# Patient Record
Sex: Female | Born: 1945 | Race: White | Hispanic: No | Marital: Married | Smoking: Never smoker
Health system: Southern US, Community
[De-identification: ages and names within clinical notes are randomized; demographics above are authoritative.]

## PROBLEM LIST (undated history)

## (undated) DIAGNOSIS — Z803 Family history of malignant neoplasm of breast: Secondary | ICD-10-CM

## (undated) DIAGNOSIS — M1990 Unspecified osteoarthritis, unspecified site: Secondary | ICD-10-CM

## (undated) DIAGNOSIS — I10 Essential (primary) hypertension: Secondary | ICD-10-CM

## (undated) DIAGNOSIS — K219 Gastro-esophageal reflux disease without esophagitis: Secondary | ICD-10-CM

## (undated) DIAGNOSIS — Z806 Family history of leukemia: Secondary | ICD-10-CM

## (undated) DIAGNOSIS — R7303 Prediabetes: Secondary | ICD-10-CM

## (undated) DIAGNOSIS — Z8051 Family history of malignant neoplasm of kidney: Secondary | ICD-10-CM

## (undated) DIAGNOSIS — E7800 Pure hypercholesterolemia, unspecified: Secondary | ICD-10-CM

## (undated) HISTORY — DX: Family history of malignant neoplasm of kidney: Z80.51

## (undated) HISTORY — DX: Family history of malignant neoplasm of breast: Z80.3

## (undated) HISTORY — PX: TUBAL LIGATION: SHX77

## (undated) HISTORY — DX: Family history of leukemia: Z80.6

---

## 1998-06-21 ENCOUNTER — Other Ambulatory Visit: Admission: RE | Admit: 1998-06-21 | Discharge: 1998-06-21 | Payer: Self-pay | Admitting: Obstetrics and Gynecology

## 1999-07-12 ENCOUNTER — Encounter: Payer: Self-pay | Admitting: Obstetrics and Gynecology

## 1999-07-12 ENCOUNTER — Encounter: Admission: RE | Admit: 1999-07-12 | Discharge: 1999-07-12 | Payer: Self-pay | Admitting: Obstetrics and Gynecology

## 1999-08-02 ENCOUNTER — Other Ambulatory Visit: Admission: RE | Admit: 1999-08-02 | Discharge: 1999-08-02 | Payer: Self-pay | Admitting: Obstetrics and Gynecology

## 2000-08-04 ENCOUNTER — Encounter: Payer: Self-pay | Admitting: Obstetrics and Gynecology

## 2000-08-04 ENCOUNTER — Encounter: Admission: RE | Admit: 2000-08-04 | Discharge: 2000-08-04 | Payer: Self-pay | Admitting: Obstetrics and Gynecology

## 2000-09-21 ENCOUNTER — Other Ambulatory Visit: Admission: RE | Admit: 2000-09-21 | Discharge: 2000-09-21 | Payer: Self-pay | Admitting: Obstetrics and Gynecology

## 2001-09-22 ENCOUNTER — Encounter: Payer: Self-pay | Admitting: Obstetrics and Gynecology

## 2001-09-22 ENCOUNTER — Encounter: Admission: RE | Admit: 2001-09-22 | Discharge: 2001-09-22 | Payer: Self-pay | Admitting: Internal Medicine

## 2002-06-28 ENCOUNTER — Ambulatory Visit (HOSPITAL_COMMUNITY): Admission: RE | Admit: 2002-06-28 | Discharge: 2002-06-28 | Payer: Self-pay | Admitting: Gastroenterology

## 2002-06-28 ENCOUNTER — Encounter (INDEPENDENT_AMBULATORY_CARE_PROVIDER_SITE_OTHER): Payer: Self-pay | Admitting: Specialist

## 2002-10-05 ENCOUNTER — Other Ambulatory Visit: Admission: RE | Admit: 2002-10-05 | Discharge: 2002-10-05 | Payer: Self-pay | Admitting: Family Medicine

## 2002-10-07 ENCOUNTER — Encounter: Admission: RE | Admit: 2002-10-07 | Discharge: 2002-10-07 | Payer: Self-pay | Admitting: Family Medicine

## 2002-10-07 ENCOUNTER — Encounter: Payer: Self-pay | Admitting: Family Medicine

## 2002-11-14 ENCOUNTER — Encounter: Admission: RE | Admit: 2002-11-14 | Discharge: 2002-12-20 | Payer: Self-pay | Admitting: Neurosurgery

## 2003-10-19 ENCOUNTER — Ambulatory Visit (HOSPITAL_COMMUNITY): Admission: RE | Admit: 2003-10-19 | Discharge: 2003-10-19 | Payer: Self-pay | Admitting: Family Medicine

## 2004-02-15 ENCOUNTER — Ambulatory Visit: Payer: Self-pay | Admitting: Cardiology

## 2004-02-23 ENCOUNTER — Ambulatory Visit: Payer: Self-pay

## 2004-11-05 ENCOUNTER — Ambulatory Visit (HOSPITAL_COMMUNITY): Admission: RE | Admit: 2004-11-05 | Discharge: 2004-11-05 | Payer: Self-pay | Admitting: Family Medicine

## 2005-11-13 ENCOUNTER — Ambulatory Visit (HOSPITAL_COMMUNITY): Admission: RE | Admit: 2005-11-13 | Discharge: 2005-11-13 | Payer: Self-pay | Admitting: Family Medicine

## 2005-11-13 ENCOUNTER — Other Ambulatory Visit: Admission: RE | Admit: 2005-11-13 | Discharge: 2005-11-13 | Payer: Self-pay | Admitting: Family Medicine

## 2006-12-04 ENCOUNTER — Ambulatory Visit (HOSPITAL_COMMUNITY): Admission: RE | Admit: 2006-12-04 | Discharge: 2006-12-04 | Payer: Self-pay | Admitting: Family Medicine

## 2007-12-08 ENCOUNTER — Ambulatory Visit: Payer: Self-pay | Admitting: Cardiology

## 2007-12-17 ENCOUNTER — Ambulatory Visit (HOSPITAL_COMMUNITY): Admission: RE | Admit: 2007-12-17 | Discharge: 2007-12-17 | Payer: Self-pay | Admitting: Family Medicine

## 2008-12-20 ENCOUNTER — Ambulatory Visit (HOSPITAL_COMMUNITY): Admission: RE | Admit: 2008-12-20 | Discharge: 2008-12-20 | Payer: Self-pay | Admitting: Family Medicine

## 2010-01-27 ENCOUNTER — Encounter: Payer: Self-pay | Admitting: Family Medicine

## 2010-01-31 ENCOUNTER — Ambulatory Visit (HOSPITAL_COMMUNITY)
Admission: RE | Admit: 2010-01-31 | Discharge: 2010-01-31 | Payer: Self-pay | Source: Home / Self Care | Attending: Family Medicine | Admitting: Family Medicine

## 2010-05-24 NOTE — Op Note (Signed)
NAMECHASTIN, Angel Barry NO.:  [id number]   MEDICAL RECORD NO.:  [id number]                   PATIENT TYPE:   LOCATION:                                       FACILITY:   PHYSICIAN:  Petra Kuba, M.D.                 DATE OF BIRTH:   DATE OF PROCEDURE:  06/28/2002  DATE OF DISCHARGE:                                 OPERATIVE REPORT   PROCEDURE:  Colonoscopy.   INDICATIONS:  Bright red blood per rectum, constipation, family history of  colon polyps, due for colonic screening.  Consent was signed after risks,  benefits, methods, and options thoroughly discussed in the office.   MEDICINES USED:  Demerol 60 mg, Versed 6 mg.   PROCEDURE:  Rectal inspection was pertinent for external hemorrhoids, small.  Digital exam was negative.  The video pediatric adjustable colonoscope was  inserted and with abdominal pressure advanced around the colon to the cecum.  No abnormalities were seen on insertion, and no position change was needed.  The cecum was identified by the appendiceal orifice and the ileocecal valve.  The scope was slowly withdrawn.  On slow withdrawal through the colon, the  prep was adequate.  There was some liquid stool that required washing and  suctioning.  In the more proximal transverse, a tiny to small possible polyp  was seen and was hot biopsied x1.  On slow withdrawal through the remaining  part of the colon, no abnormalities were seen.  Specifically, no other  polyps, masses, diverticula, or signs of bleeding.  Anorectal pull-through  and retroflexion confirmed some hemorrhoids in the rectum.  The scope was  reinserted a short way up the left side of the colon, air was suctioned,  scope removed.  The patient tolerated the procedure well.  There was no  obvious immediate complication.   ENDOSCOPIC DIAGNOSES:  1. Internal-external hemorrhoids.  2. One tiny to small proximal transverse polyp, hot biopsied.  3. Otherwise within normal  limits to the cecum.   PLAN:  Await pathology to determine future colonic screening.  Happy to see  back p.r.n., otherwise return care to Dr. Celene Skeen for the customary health  care maintenance to include yearly rectals and guaiacs, and would  follow up  to her for constipation and hemorrhoidal treatment and re-referral if  question or problem.  Might consider Crystallose, Miralax, or even Zelnorm  at some point in the future and various hemorrhoidal creams or suppositories  as needed.                                               Petra Kuba, M.D.    MEM/MEDQ  D:  06/28/2002  T:  06/28/2002  Job:  [id number]   cc:   Byrd Hesselbach  [place]  [address]  [place] [address]  Fax: [phone]

## 2011-02-03 ENCOUNTER — Other Ambulatory Visit (HOSPITAL_COMMUNITY): Payer: Self-pay | Admitting: Family Medicine

## 2011-02-03 DIAGNOSIS — Z139 Encounter for screening, unspecified: Secondary | ICD-10-CM

## 2011-02-06 ENCOUNTER — Ambulatory Visit (HOSPITAL_COMMUNITY)
Admission: RE | Admit: 2011-02-06 | Discharge: 2011-02-06 | Disposition: A | Discharge status: Home / Self Care | Payer: Medicare Other | Source: Ambulatory Visit | Attending: Family Medicine | Admitting: Family Medicine

## 2011-02-06 DIAGNOSIS — Z139 Encounter for screening, unspecified: Secondary | ICD-10-CM

## 2011-02-06 DIAGNOSIS — Z1231 Encounter for screening mammogram for malignant neoplasm of breast: Secondary | ICD-10-CM | POA: Insufficient documentation

## 2012-02-04 ENCOUNTER — Other Ambulatory Visit (HOSPITAL_COMMUNITY): Payer: Self-pay | Admitting: Family Medicine

## 2012-02-04 DIAGNOSIS — Z139 Encounter for screening, unspecified: Secondary | ICD-10-CM

## 2012-02-09 ENCOUNTER — Ambulatory Visit (HOSPITAL_COMMUNITY)
Admission: RE | Admit: 2012-02-09 | Discharge: 2012-02-09 | Disposition: A | Discharge status: Home / Self Care | Payer: PRIVATE HEALTH INSURANCE | Source: Ambulatory Visit | Attending: Family Medicine | Admitting: Family Medicine

## 2012-02-09 DIAGNOSIS — Z1231 Encounter for screening mammogram for malignant neoplasm of breast: Secondary | ICD-10-CM | POA: Insufficient documentation

## 2012-02-09 DIAGNOSIS — Z139 Encounter for screening, unspecified: Secondary | ICD-10-CM

## 2013-02-11 ENCOUNTER — Other Ambulatory Visit (HOSPITAL_COMMUNITY): Payer: Self-pay | Admitting: Family Medicine

## 2013-02-11 DIAGNOSIS — Z139 Encounter for screening, unspecified: Secondary | ICD-10-CM

## 2013-02-17 ENCOUNTER — Ambulatory Visit (HOSPITAL_COMMUNITY)
Admission: RE | Admit: 2013-02-17 | Discharge: 2013-02-17 | Disposition: A | Discharge status: Home / Self Care | Payer: Medicare Other | Source: Ambulatory Visit | Attending: Family Medicine | Admitting: Family Medicine

## 2013-02-17 ENCOUNTER — Other Ambulatory Visit (HOSPITAL_COMMUNITY): Payer: Self-pay | Admitting: Family Medicine

## 2013-02-17 DIAGNOSIS — Z1231 Encounter for screening mammogram for malignant neoplasm of breast: Secondary | ICD-10-CM | POA: Insufficient documentation

## 2013-02-21 ENCOUNTER — Other Ambulatory Visit: Payer: Self-pay | Admitting: Family Medicine

## 2013-02-21 DIAGNOSIS — R928 Other abnormal and inconclusive findings on diagnostic imaging of breast: Secondary | ICD-10-CM

## 2013-03-09 ENCOUNTER — Ambulatory Visit (HOSPITAL_COMMUNITY)
Admission: RE | Admit: 2013-03-09 | Discharge: 2013-03-09 | Disposition: A | Discharge status: Home / Self Care | Payer: Medicare Other | Source: Ambulatory Visit | Attending: Family Medicine | Admitting: Family Medicine

## 2013-03-09 DIAGNOSIS — R928 Other abnormal and inconclusive findings on diagnostic imaging of breast: Secondary | ICD-10-CM | POA: Insufficient documentation

## 2013-03-17 ENCOUNTER — Other Ambulatory Visit: Payer: Self-pay | Admitting: Gastroenterology

## 2013-05-18 ENCOUNTER — Other Ambulatory Visit (HOSPITAL_COMMUNITY): Payer: Self-pay | Admitting: Adult Health Nurse Practitioner

## 2013-05-18 DIAGNOSIS — M810 Age-related osteoporosis without current pathological fracture: Secondary | ICD-10-CM

## 2013-05-23 ENCOUNTER — Other Ambulatory Visit (HOSPITAL_COMMUNITY): Payer: Medicare Other

## 2013-06-13 ENCOUNTER — Ambulatory Visit (HOSPITAL_COMMUNITY)
Admission: RE | Admit: 2013-06-13 | Discharge: 2013-06-13 | Disposition: A | Discharge status: Home / Self Care | Payer: Medicare Other | Source: Ambulatory Visit | Attending: Adult Health Nurse Practitioner | Admitting: Adult Health Nurse Practitioner

## 2013-06-13 DIAGNOSIS — M810 Age-related osteoporosis without current pathological fracture: Secondary | ICD-10-CM | POA: Insufficient documentation

## 2014-01-03 ENCOUNTER — Ambulatory Visit: Payer: Medicare Other | Admitting: Neurology

## 2014-04-04 ENCOUNTER — Other Ambulatory Visit (HOSPITAL_COMMUNITY): Payer: Self-pay | Admitting: Family Medicine

## 2014-04-04 DIAGNOSIS — Z1231 Encounter for screening mammogram for malignant neoplasm of breast: Secondary | ICD-10-CM

## 2014-04-17 ENCOUNTER — Ambulatory Visit (HOSPITAL_COMMUNITY)
Admission: RE | Admit: 2014-04-17 | Discharge: 2014-04-17 | Disposition: A | Discharge status: Home / Self Care | Payer: Medicare Other | Source: Ambulatory Visit | Attending: Family Medicine | Admitting: Family Medicine

## 2014-04-17 DIAGNOSIS — Z1231 Encounter for screening mammogram for malignant neoplasm of breast: Secondary | ICD-10-CM | POA: Insufficient documentation

## 2015-05-02 ENCOUNTER — Other Ambulatory Visit (HOSPITAL_COMMUNITY): Payer: Self-pay | Admitting: Family Medicine

## 2015-05-02 DIAGNOSIS — Z1231 Encounter for screening mammogram for malignant neoplasm of breast: Secondary | ICD-10-CM

## 2015-05-04 ENCOUNTER — Ambulatory Visit (HOSPITAL_COMMUNITY)
Admission: RE | Admit: 2015-05-04 | Discharge: 2015-05-04 | Disposition: A | Discharge status: Home / Self Care | Payer: Medicare Other | Source: Ambulatory Visit | Attending: Family Medicine | Admitting: Family Medicine

## 2015-05-04 DIAGNOSIS — Z1231 Encounter for screening mammogram for malignant neoplasm of breast: Secondary | ICD-10-CM

## 2016-04-23 ENCOUNTER — Other Ambulatory Visit (HOSPITAL_COMMUNITY): Payer: Self-pay | Admitting: Family Medicine

## 2016-04-23 DIAGNOSIS — Z1231 Encounter for screening mammogram for malignant neoplasm of breast: Secondary | ICD-10-CM

## 2016-05-05 ENCOUNTER — Ambulatory Visit (HOSPITAL_COMMUNITY)
Admission: RE | Admit: 2016-05-05 | Discharge: 2016-05-05 | Disposition: A | Discharge status: Home / Self Care | Payer: Medicare Other | Source: Ambulatory Visit | Attending: Family Medicine | Admitting: Family Medicine

## 2016-05-05 DIAGNOSIS — Z1231 Encounter for screening mammogram for malignant neoplasm of breast: Secondary | ICD-10-CM | POA: Diagnosis not present

## 2016-12-09 NOTE — Patient Instructions (Signed)
Your procedure is scheduled on: 12/18/2016  Report to [facility] at  10   AM.  Call this number if you have problems the morning of surgery: [phone]   Do not eat food or drink liquids :After Midnight.      Take these medicines the morning of surgery with A SIP OF WATER: momopril, prilosec.   Do not wear jewelry, make-up or nail polish.  Do not wear lotions, powders, or perfumes. You may wear deodorant.  Do not shave 48 hours prior to surgery.  Do not bring valuables to the hospital.  Contacts, dentures or bridgework may not be worn into surgery.  Leave suitcase in the car. After surgery it may be brought to your room.  For patients admitted to the hospital, checkout time is 11:00 AM the day of discharge.   Patients discharged the day of surgery will not be allowed to drive home.  :     Please read over the following fact sheets that you were given: Coughing and Deep Breathing, Surgical Site Infection Prevention, Anesthesia Post-op Instructions and Care and Recovery After Surgery    Cataract A cataract is a clouding of the lens of the eye. When a lens becomes cloudy, vision is reduced based on the degree and nature of the clouding. Many cataracts reduce vision to some degree. Some cataracts make people more near-sighted as they develop. Other cataracts increase glare. Cataracts that are ignored and become worse can sometimes look white. The white color can be seen through the pupil. CAUSES   Aging. However, cataracts may occur at any age, even in newborns.   Certain drugs.   Trauma to the eye.   Certain diseases such as diabetes.   Specific eye diseases such as chronic inflammation inside the eye or a sudden attack of a rare form of glaucoma.   Inherited or acquired medical problems.  SYMPTOMS   Gradual, progressive drop in vision in the affected eye.   Severe, rapid visual loss. This most often happens when trauma is the cause.  DIAGNOSIS  To detect a cataract, an eye  doctor examines the lens. Cataracts are best diagnosed with an exam of the eyes with the pupils enlarged (dilated) by drops.  TREATMENT  For an early cataract, vision may improve by using different eyeglasses or stronger lighting. If that does not help your vision, surgery is the only effective treatment. A cataract needs to be surgically removed when vision loss interferes with your everyday activities, such as driving, reading, or watching TV. A cataract may also have to be removed if it prevents examination or treatment of another eye problem. Surgery removes the cloudy lens and usually replaces it with a substitute lens (intraocular lens, IOL).  At a time when both you and your doctor agree, the cataract will be surgically removed. If you have cataracts in both eyes, only one is usually removed at a time. This allows the operated eye to heal and be out of danger from any possible problems after surgery (such as infection or poor wound healing). In rare cases, a cataract may be doing damage to your eye. In these cases, your caregiver may advise surgical removal right away. The vast majority of people who have cataract surgery have better vision afterward. HOME CARE INSTRUCTIONS  If you are not planning surgery, you may be asked to do the following:  Use different eyeglasses.   Use stronger or brighter lighting.   Ask your eye doctor about reducing your medicine  dose or changing medicines if it is thought that a medicine caused your cataract. Changing medicines does not make the cataract go away on its own.   Become familiar with your surroundings. Poor vision can lead to injury. Avoid bumping into things on the affected side. You are at a higher risk for tripping or falling.   Exercise extreme care when driving or operating machinery.   Wear sunglasses if you are sensitive to bright light or experiencing problems with glare.  SEEK IMMEDIATE MEDICAL CARE IF:   You have a worsening or sudden  vision loss.   You notice redness, swelling, or increasing pain in the eye.   You have a fever.  Document Released: 12/23/2004 Document Revised: 12/12/2010 Document Reviewed: 08/16/2010 [facility] Patient Information 2012 [place].PATIENT INSTRUCTIONS POST-ANESTHESIA  IMMEDIATELY FOLLOWING SURGERY:  Do not drive or operate machinery for the first twenty four hours after surgery.  Do not make any important decisions for twenty four hours after surgery or while taking narcotic pain medications or sedatives.  If you develop intractable nausea and vomiting or a severe headache please notify your doctor immediately.  FOLLOW-UP:  Please make an appointment with your surgeon as instructed. You do not need to follow up with anesthesia unless specifically instructed to do so.  WOUND CARE INSTRUCTIONS (if applicable):  Keep a dry clean dressing on the anesthesia/puncture wound site if there is drainage.  Once the wound has quit draining you may leave it open to air.  Generally you should leave the bandage intact for twenty four hours unless there is drainage.  If the epidural site drains for more than 36-48 hours please call the anesthesia department.  QUESTIONS?:  Please feel free to call your physician or the hospital operator if you have any questions, and they will be happy to assist you.

## 2016-12-11 ENCOUNTER — Encounter (HOSPITAL_COMMUNITY)
Admission: RE | Admit: 2016-12-11 | Discharge: 2016-12-11 | Disposition: A | Discharge status: Home / Self Care | Payer: Medicare Other | Source: Ambulatory Visit | Attending: Ophthalmology | Admitting: Ophthalmology

## 2016-12-11 ENCOUNTER — Encounter (HOSPITAL_COMMUNITY): Payer: Self-pay

## 2016-12-11 ENCOUNTER — Other Ambulatory Visit: Payer: Self-pay

## 2016-12-11 DIAGNOSIS — Z01812 Encounter for preprocedural laboratory examination: Secondary | ICD-10-CM | POA: Insufficient documentation

## 2016-12-11 DIAGNOSIS — Z01810 Encounter for preprocedural cardiovascular examination: Secondary | ICD-10-CM | POA: Diagnosis not present

## 2016-12-11 DIAGNOSIS — H268 Other specified cataract: Secondary | ICD-10-CM | POA: Diagnosis not present

## 2016-12-11 HISTORY — DX: Pure hypercholesterolemia, unspecified: E78.00

## 2016-12-11 HISTORY — DX: Gastro-esophageal reflux disease without esophagitis: K21.9

## 2016-12-11 HISTORY — DX: Essential (primary) hypertension: I10

## 2016-12-11 LAB — CBC WITH DIFFERENTIAL/PLATELET
BASOS ABS: 0 10*3/uL (ref 0.0–0.1)
BASOS PCT: 1 %
Eosinophils Absolute: 0.1 10*3/uL (ref 0.0–0.7)
Eosinophils Relative: 1 %
HEMATOCRIT: 44 % (ref 36.0–46.0)
Hemoglobin: 14.2 g/dL (ref 12.0–15.0)
LYMPHS PCT: 31 %
Lymphs Abs: 1.8 10*3/uL (ref 0.7–4.0)
MCH: 31.1 pg (ref 26.0–34.0)
MCHC: 32.3 g/dL (ref 30.0–36.0)
MCV: 96.5 fL (ref 78.0–100.0)
MONO ABS: 0.5 10*3/uL (ref 0.1–1.0)
Monocytes Relative: 9 %
NEUTROS ABS: 3.4 10*3/uL (ref 1.7–7.7)
NEUTROS PCT: 58 %
PLATELETS: 137 10*3/uL — AB (ref 150–400)
RBC: 4.56 MIL/uL (ref 3.87–5.11)
RDW: 12.8 % (ref 11.5–15.5)
WBC: 5.9 10*3/uL (ref 4.0–10.5)

## 2016-12-11 LAB — BASIC METABOLIC PANEL
ANION GAP: 7 (ref 5–15)
BUN: 23 mg/dL — ABNORMAL HIGH (ref 6–20)
CALCIUM: 9.1 mg/dL (ref 8.9–10.3)
CO2: 27 mmol/L (ref 22–32)
Chloride: 107 mmol/L (ref 101–111)
Creatinine, Ser: 0.69 mg/dL (ref 0.44–1.00)
GLUCOSE: 88 mg/dL (ref 65–99)
POTASSIUM: 3.7 mmol/L (ref 3.5–5.1)
Sodium: 141 mmol/L (ref 135–145)

## 2016-12-18 ENCOUNTER — Ambulatory Visit (HOSPITAL_COMMUNITY): Payer: Medicare Other | Admitting: Anesthesiology

## 2016-12-18 ENCOUNTER — Encounter (HOSPITAL_COMMUNITY): Payer: Self-pay | Admitting: *Deleted

## 2016-12-18 ENCOUNTER — Encounter (HOSPITAL_COMMUNITY)
Admission: RE | Disposition: A | Discharge status: Home / Self Care | Payer: Self-pay | Source: Ambulatory Visit | Attending: Ophthalmology

## 2016-12-18 ENCOUNTER — Ambulatory Visit (HOSPITAL_COMMUNITY)
Admission: RE | Admit: 2016-12-18 | Discharge: 2016-12-18 | Disposition: A | Discharge status: Home / Self Care | Payer: Medicare Other | Source: Ambulatory Visit | Attending: Ophthalmology | Admitting: Ophthalmology

## 2016-12-18 DIAGNOSIS — H269 Unspecified cataract: Secondary | ICD-10-CM | POA: Diagnosis not present

## 2016-12-18 DIAGNOSIS — Z79899 Other long term (current) drug therapy: Secondary | ICD-10-CM | POA: Diagnosis not present

## 2016-12-18 DIAGNOSIS — I10 Essential (primary) hypertension: Secondary | ICD-10-CM | POA: Insufficient documentation

## 2016-12-18 HISTORY — PX: CATARACT EXTRACTION W/PHACO: SHX586

## 2016-12-18 SURGERY — PHACOEMULSIFICATION, CATARACT, WITH IOL INSERTION
Anesthesia: Monitor Anesthesia Care | Site: Eye | Laterality: Left

## 2016-12-18 MED ORDER — FENTANYL CITRATE (PF) 100 MCG/2ML IJ SOLN
25.0000 ug | Freq: Once | INTRAMUSCULAR | Status: AC
  Administered 2016-12-18: 25 ug via INTRAVENOUS

## 2016-12-18 MED ORDER — FENTANYL CITRATE (PF) 100 MCG/2ML IJ SOLN
INTRAMUSCULAR | Status: AC
  Filled 2016-12-18: qty 2

## 2016-12-18 MED ORDER — LIDOCAINE HCL 3.5 % OP GEL
1.0000 "application " | Freq: Once | OPHTHALMIC | Status: AC
  Administered 2016-12-18: 1 via OPHTHALMIC

## 2016-12-18 MED ORDER — LACTATED RINGERS IV SOLN
INTRAVENOUS | Status: DC
  Administered 2016-12-18: 08:00:00 via INTRAVENOUS

## 2016-12-18 MED ORDER — LIDOCAINE HCL (PF) 1 % IJ SOLN
INTRAMUSCULAR | Status: AC
Start: 2016-12-18 — End: ?
  Filled 2016-12-18: qty 2

## 2016-12-18 MED ORDER — POVIDONE-IODINE 5 % OP SOLN
OPHTHALMIC | Status: DC | PRN
  Administered 2016-12-18: 1 via OPHTHALMIC

## 2016-12-18 MED ORDER — NEOMYCIN-POLYMYXIN-DEXAMETH 3.5-10000-0.1 OP SUSP
OPHTHALMIC | Status: AC
Start: 2016-12-18 — End: ?
  Filled 2016-12-18: qty 5

## 2016-12-18 MED ORDER — MIDAZOLAM HCL 2 MG/2ML IJ SOLN
1.0000 mg | INTRAMUSCULAR | Status: AC
  Administered 2016-12-18: 2 mg via INTRAVENOUS

## 2016-12-18 MED ORDER — SODIUM HYALURONATE 23 MG/ML IO SOLN
INTRAOCULAR | Status: DC | PRN
  Administered 2016-12-18: 0.6 mL via INTRAOCULAR

## 2016-12-18 MED ORDER — TETRACAINE HCL 0.5 % OP SOLN
1.0000 [drp] | OPHTHALMIC | Status: AC
Start: 2016-12-18 — End: 2016-12-18
  Administered 2016-12-18 (×3): 1 [drp] via OPHTHALMIC

## 2016-12-18 MED ORDER — MIDAZOLAM HCL 2 MG/2ML IJ SOLN
INTRAMUSCULAR | Status: AC
  Filled 2016-12-18: qty 2

## 2016-12-18 MED ORDER — PROVISC 10 MG/ML IO SOLN
INTRAOCULAR | Status: DC | PRN
  Administered 2016-12-18: 0.85 mL via INTRAOCULAR

## 2016-12-18 MED ORDER — BSS IO SOLN
INTRAOCULAR | Status: DC | PRN
  Administered 2016-12-18: 15 mL

## 2016-12-18 MED ORDER — NEOMYCIN-POLYMYXIN-DEXAMETH 3.5-10000-0.1 OP SUSP
OPHTHALMIC | Status: DC | PRN
  Administered 2016-12-18: 2 [drp] via OPHTHALMIC

## 2016-12-18 MED ORDER — LIDOCAINE HCL (PF) 1 % IJ SOLN
INTRAOCULAR | Status: DC | PRN
  Administered 2016-12-18: 1 mL via OPHTHALMIC

## 2016-12-18 MED ORDER — CYCLOPENTOLATE-PHENYLEPHRINE 0.2-1 % OP SOLN
1.0000 [drp] | OPHTHALMIC | Status: AC
  Administered 2016-12-18 (×3): 1 [drp] via OPHTHALMIC

## 2016-12-18 MED ORDER — PHENYLEPHRINE HCL 2.5 % OP SOLN
1.0000 [drp] | OPHTHALMIC | Status: AC
Start: 2016-12-18 — End: 2016-12-18
  Administered 2016-12-18 (×3): 1 [drp] via OPHTHALMIC

## 2016-12-18 MED ORDER — PHENYLEPHRINE HCL 2.5 % OP SOLN
OPHTHALMIC | Status: AC
  Filled 2016-12-18: qty 15

## 2016-12-18 MED ORDER — EPINEPHRINE PF 1 MG/ML IJ SOLN
INTRAOCULAR | Status: DC | PRN
  Administered 2016-12-18: 500 mL

## 2016-12-18 SURGICAL SUPPLY — 14 items
CLOTH BEACON ORANGE TIMEOUT ST (SAFETY) ×3 IMPLANT
EYE SHIELD UNIVERSAL CLEAR (GAUZE/BANDAGES/DRESSINGS) ×3 IMPLANT
GLOVE BIOGEL PI IND STRL 6.5 (GLOVE) ×1 IMPLANT
GLOVE BIOGEL PI IND STRL 7.0 (GLOVE) ×1 IMPLANT
GLOVE BIOGEL PI INDICATOR 6.5 (GLOVE) ×2
GLOVE BIOGEL PI INDICATOR 7.0 (GLOVE) ×2
LENS ALC ACRYL/TECN (Ophthalmic Related) ×3 IMPLANT
NEEDLE HYPO 18GX1.5 BLUNT FILL (NEEDLE) ×3 IMPLANT
PAD ARMBOARD 7.5X6 YLW CONV (MISCELLANEOUS) ×3 IMPLANT
SYR TB 1ML LL NO SAFETY (SYRINGE) ×3 IMPLANT
TAPE SURG TRANSPORE 1 IN (GAUZE/BANDAGES/DRESSINGS) ×1 IMPLANT
TAPE SURGICAL TRANSPORE 1 IN (GAUZE/BANDAGES/DRESSINGS) ×2
VISCOELASTIC ADDITIONAL (OPHTHALMIC RELATED) ×3 IMPLANT
WATER STERILE IRR 250ML POUR (IV SOLUTION) ×3 IMPLANT

## 2016-12-18 NOTE — Discharge Instructions (Signed)
Please discharge patient when stable, will follow up today with Dr. Wrzosek at the [facility] office immediately following discharge.  Leave shield in place until visit.  All paperwork with discharge instructions will be given at the office. ° ° °Moderate Conscious Sedation, Adult, Care After °These instructions provide you with information about caring for yourself after your procedure. Your health care provider may also give you more specific instructions. Your treatment has been planned according to current medical practices, but problems sometimes occur. Call your health care provider if you have any problems or questions after your procedure. °What can I expect after the procedure? °After your procedure, it is common: °· To feel sleepy for several hours. °· To feel clumsy and have poor balance for several hours. °· To have poor judgment for several hours. °· To vomit if you eat too soon. ° °Follow these instructions at home: °For at least 24 hours after the procedure: ° °· Do not: °? Participate in activities where you could fall or become injured. °? Drive. °? Use heavy machinery. °? Drink alcohol. °? Take sleeping pills or medicines that cause drowsiness. °? Make important decisions or sign legal documents. °? Take care of children on your own. °· Rest. °Eating and drinking °· Follow the diet recommended by your health care provider. °· If you vomit: °? Drink water, juice, or soup when you can drink without vomiting. °? Make sure you have little or no nausea before eating solid foods. °General instructions °· Have a responsible adult stay with you until you are awake and alert. °· Take over-the-counter and prescription medicines only as told by your health care provider. °· If you smoke, do not smoke without supervision. °· Keep all follow-up visits as told by your health care provider. This is important. °Contact a health care provider if: °· You keep feeling nauseous or you keep vomiting. °· You  feel light-headed. °· You develop a rash. °· You have a fever. °Get help right away if: °· You have trouble breathing. °This information is not intended to replace advice given to you by your health care provider. Make sure you discuss any questions you have with your health care provider. °Document Released: 10/13/2012 Document Revised: 05/28/2015 Document Reviewed: 04/14/2015 °Elsevier Interactive Patient Education © 2018 [employer] ° ° °

## 2016-12-18 NOTE — Anesthesia Preprocedure Evaluation (Signed)
Anesthesia Evaluation  Patient identified by MRN, date of birth, ID band Patient awake    Reviewed: Allergy & Precautions, NPO status , Patient's Chart, lab work & pertinent test results  Airway Mallampati: I  TM Distance: >3 FB     Dental  (+) Teeth Intact   Pulmonary neg pulmonary ROS,    breath sounds clear to auscultation       Cardiovascular hypertension, Pt. on medications  Rhythm:Regular     Neuro/Psych negative neurological ROS  negative psych ROS   GI/Hepatic GERD  Controlled,  Endo/Other    Renal/GU      Musculoskeletal   Abdominal   Peds  Hematology   Anesthesia Other Findings   Reproductive/Obstetrics                             Anesthesia Physical Anesthesia Plan  ASA: II  Anesthesia Plan: MAC   Post-op Pain Management:    Induction: Intravenous  PONV Risk Score and Plan:   Airway Management Planned: Simple Face Mask  Additional Equipment:   Intra-op Plan:   Post-operative Plan:   Informed Consent: I have reviewed the patients History and Physical, chart, labs and discussed the procedure including the risks, benefits and alternatives for the proposed anesthesia with the patient or authorized representative who has indicated his/her understanding and acceptance.     Plan Discussed with:   Anesthesia Plan Comments:         Anesthesia Quick Evaluation

## 2016-12-18 NOTE — Transfer of Care (Signed)
Immediate Anesthesia Transfer of Care Note  Patient: Angel Barry  Procedure(s) Performed: CATARACT EXTRACTION PHACO AND INTRAOCULAR LENS PLACEMENT (IOC) (Left Eye)  Patient Location: Short Stay  Anesthesia Type:MAC  Level of Consciousness: awake, alert  and patient cooperative  Airway & Oxygen Therapy: Patient Spontanous Breathing  Post-op Assessment: Report given to RN and Post -op Vital signs reviewed and stable  Post vital signs: Reviewed and stable  Last Vitals:  Vitals:   12/18/16 0745 12/18/16 0800  BP: (!) 93/55 (!) 103/57  Resp: 14 18  Temp:    SpO2: 96% 97%    Last Pain:  Vitals:   12/18/16 0629  TempSrc: Oral      Patients Stated Pain Goal: 4 (50/93/26 7124)  Complications: No apparent anesthesia complications

## 2016-12-18 NOTE — H&P (Signed)
The H and P was reviewed and updated. The patient was examined.  No changes were found after exam.  The surgical eye was marked.  

## 2016-12-18 NOTE — Anesthesia Postprocedure Evaluation (Signed)
Anesthesia Post Note  Patient: ANNALISSE MINKOFF  Procedure(s) Performed: CATARACT EXTRACTION PHACO AND INTRAOCULAR LENS PLACEMENT ([place]) (Left Eye)  Patient location during evaluation: Short Stay Anesthesia Type: MAC Level of consciousness: awake and alert Pain management: satisfactory to patient Vital Signs Assessment: post-procedure vital signs reviewed and stable Respiratory status: spontaneous breathing Cardiovascular status: stable Postop Assessment: no apparent nausea or vomiting Anesthetic complications: no     Last Vitals:  Vitals:   12/18/16 0745 12/18/16 0800  BP: (!) 93/55 (!) 103/57  Resp: 14 18  Temp:    SpO2: 96% 97%    Last Pain:  Vitals:   12/18/16 0629  TempSrc: Oral                 Yolander Goodie

## 2016-12-18 NOTE — Op Note (Signed)
Date of procedure: 12/18/16  Pre-operative diagnosis: Visually significant cataract, Left Eye  Post-operative diagnosis: Visually significant cataract, Left Eye  Procedure: Removal of cataract via phacoemulsification and insertion of intra-ocular lens AMO PCB00  +23.5D into the capsular bag of the Left Eye  Attending surgeon: Gerda Diss. Loyal Rudy, MD, MA  Anesthesia: MAC, Topical Akten  Complications: None  Estimated Blood Loss: <69m (minimal)  Specimens: None  Implants: As above  Indications:  Visually significant cataract, Left Eye  Procedure:  The patient was seen and identified in the pre-operative area. The operative eye was identified and dilated.  The operative eye was marked.  Topical anesthesia was administered to the operative eye.     The patient was then to the operative suite and placed in the supine position.  A timeout was performed confirming the patient, procedure to be performed, and all other relevant information.   The patient's face was prepped and draped in the usual fashion for intra-ocular surgery.  A lid speculum was placed into the operative eye and the surgical microscope moved into place and focused.  A superotemporal paracentesis was created using a 20 gauge paracentesis blade.  Shugarcaine was injected into the anterior chamber.  Viscoelastic was injected into the anterior chamber.  A temporal clear-corneal main wound incision was created using a 2.438mmicrokeratome.  A continuous curvilinear capsulorrhexis was initiated using an irrigating cystitome and completed using capsulorrhexis forceps.  Hydrodissection and hydrodeliniation were performed.  Viscoelastic was injected into the anterior chamber.  A phacoemulsification handpiece and a chopper as a second instrument were used to remove the nucleus and epinucleus. The irrigation/aspiration handpiece was used to remove any remaining cortical material.   The capsular bag was reinflated with viscoelastic, checked,  and found to be intact.  The intraocular lens was inserted into the capsular bag and dialed into place.  The irrigation/aspiration handpiece was used to remove any remaining viscoelastic.  The clear corneal wound and paracentesis wounds were then hydrated and checked with Weck-Cels to be watertight.  The lid-speculum and drape was removed, and the patient's face was cleaned with a wet and dry 4x4.  Maxitrol was instilled in the eye before a clear shield was taped over the eye. The patient was taken to the post-operative care unit in good condition, having tolerated the procedure well.  Post-Op Instructions: [facility] and will receive all other orders and instructions.

## 2016-12-19 ENCOUNTER — Encounter (HOSPITAL_COMMUNITY): Payer: Self-pay | Admitting: Ophthalmology

## 2017-04-29 ENCOUNTER — Other Ambulatory Visit (HOSPITAL_COMMUNITY): Payer: Self-pay | Admitting: Family Medicine

## 2017-04-29 DIAGNOSIS — Z1231 Encounter for screening mammogram for malignant neoplasm of breast: Secondary | ICD-10-CM

## 2017-05-15 ENCOUNTER — Ambulatory Visit (HOSPITAL_COMMUNITY): Payer: Medicare Other

## 2017-05-28 ENCOUNTER — Ambulatory Visit (HOSPITAL_COMMUNITY)
Admission: RE | Admit: 2017-05-28 | Discharge: 2017-05-28 | Disposition: A | Discharge status: Home / Self Care | Payer: Medicare Other | Source: Ambulatory Visit | Attending: Family Medicine | Admitting: Family Medicine

## 2017-05-28 ENCOUNTER — Encounter (HOSPITAL_COMMUNITY): Payer: Self-pay

## 2017-05-28 DIAGNOSIS — Z1231 Encounter for screening mammogram for malignant neoplasm of breast: Secondary | ICD-10-CM | POA: Diagnosis present

## 2018-07-06 ENCOUNTER — Other Ambulatory Visit (HOSPITAL_COMMUNITY): Payer: Self-pay | Admitting: Family Medicine

## 2018-07-06 DIAGNOSIS — Z1231 Encounter for screening mammogram for malignant neoplasm of breast: Secondary | ICD-10-CM

## 2018-08-03 ENCOUNTER — Encounter (HOSPITAL_COMMUNITY): Payer: Self-pay

## 2018-08-03 ENCOUNTER — Other Ambulatory Visit: Payer: Self-pay

## 2018-08-03 ENCOUNTER — Encounter (HOSPITAL_COMMUNITY)
Admission: RE | Admit: 2018-08-03 | Discharge: 2018-08-03 | Disposition: A | Discharge status: Home / Self Care | Payer: Medicare Other | Source: Ambulatory Visit | Attending: Ophthalmology | Admitting: Ophthalmology

## 2018-08-04 ENCOUNTER — Other Ambulatory Visit (HOSPITAL_COMMUNITY)
Admission: RE | Admit: 2018-08-04 | Discharge: 2018-08-04 | Disposition: A | Discharge status: Home / Self Care | Payer: Medicare Other | Source: Ambulatory Visit | Attending: Ophthalmology | Admitting: Ophthalmology

## 2018-08-04 DIAGNOSIS — Z20828 Contact with and (suspected) exposure to other viral communicable diseases: Secondary | ICD-10-CM | POA: Diagnosis present

## 2018-08-04 LAB — SARS CORONAVIRUS 2 (TAT 6-24 HRS): SARS Coronavirus 2: NEGATIVE

## 2018-08-06 ENCOUNTER — Encounter (HOSPITAL_COMMUNITY): Payer: Self-pay | Admitting: *Deleted

## 2018-08-06 ENCOUNTER — Encounter (HOSPITAL_COMMUNITY)
Admission: RE | Disposition: A | Discharge status: Home / Self Care | Payer: Self-pay | Source: Home / Self Care | Attending: Ophthalmology

## 2018-08-06 ENCOUNTER — Ambulatory Visit (HOSPITAL_COMMUNITY): Payer: Medicare Other | Admitting: Anesthesiology

## 2018-08-06 ENCOUNTER — Ambulatory Visit (HOSPITAL_COMMUNITY)
Admission: RE | Admit: 2018-08-06 | Discharge: 2018-08-06 | Disposition: A | Discharge status: Home / Self Care | Payer: Medicare Other | Attending: Ophthalmology | Admitting: Ophthalmology

## 2018-08-06 ENCOUNTER — Other Ambulatory Visit: Payer: Self-pay

## 2018-08-06 DIAGNOSIS — H259 Unspecified age-related cataract: Secondary | ICD-10-CM | POA: Diagnosis present

## 2018-08-06 DIAGNOSIS — Z791 Long term (current) use of non-steroidal anti-inflammatories (NSAID): Secondary | ICD-10-CM | POA: Insufficient documentation

## 2018-08-06 DIAGNOSIS — I10 Essential (primary) hypertension: Secondary | ICD-10-CM | POA: Diagnosis not present

## 2018-08-06 DIAGNOSIS — E7800 Pure hypercholesterolemia, unspecified: Secondary | ICD-10-CM | POA: Insufficient documentation

## 2018-08-06 HISTORY — PX: CATARACT EXTRACTION W/PHACO: SHX586

## 2018-08-06 SURGERY — PHACOEMULSIFICATION, CATARACT, WITH IOL INSERTION
Anesthesia: Monitor Anesthesia Care | Site: Eye | Laterality: Right

## 2018-08-06 MED ORDER — BSS IO SOLN
INTRAOCULAR | Status: DC | PRN
  Administered 2018-08-06: 15 mL

## 2018-08-06 MED ORDER — EPINEPHRINE PF 1 MG/ML IJ SOLN
INTRAOCULAR | Status: DC | PRN
  Administered 2018-08-06: 09:00:00 500 mL

## 2018-08-06 MED ORDER — HYDROMORPHONE HCL 1 MG/ML IJ SOLN: 0.2500 mg | INTRAMUSCULAR | Status: DC | PRN

## 2018-08-06 MED ORDER — PROMETHAZINE HCL 25 MG/ML IJ SOLN: 6.2500 mg | INTRAMUSCULAR | Status: DC | PRN

## 2018-08-06 MED ORDER — EPINEPHRINE PF 1 MG/ML IJ SOLN
INTRAMUSCULAR | Status: AC
  Filled 2018-08-06: qty 2

## 2018-08-06 MED ORDER — LIDOCAINE HCL (PF) 1 % IJ SOLN
INTRAOCULAR | Status: DC | PRN
  Administered 2018-08-06: 09:00:00 1 mL via OPHTHALMIC

## 2018-08-06 MED ORDER — NEOMYCIN-POLYMYXIN-DEXAMETH 3.5-10000-0.1 OP SUSP
OPHTHALMIC | Status: DC | PRN
  Administered 2018-08-06: 2 [drp] via OPHTHALMIC

## 2018-08-06 MED ORDER — SODIUM HYALURONATE 23 MG/ML IO SOLN
INTRAOCULAR | Status: DC | PRN
  Administered 2018-08-06: 0.6 mL via INTRAOCULAR

## 2018-08-06 MED ORDER — LIDOCAINE HCL 3.5 % OP GEL
1.0000 "application " | Freq: Once | OPHTHALMIC | Status: AC
  Administered 2018-08-06: 1 via OPHTHALMIC

## 2018-08-06 MED ORDER — LACTATED RINGERS IV SOLN: INTRAVENOUS | Status: DC

## 2018-08-06 MED ORDER — MIDAZOLAM HCL 2 MG/2ML IJ SOLN
INTRAMUSCULAR | Status: AC
  Filled 2018-08-06: qty 2

## 2018-08-06 MED ORDER — MIDAZOLAM HCL 5 MG/5ML IJ SOLN
INTRAMUSCULAR | Status: DC | PRN
  Administered 2018-08-06: 2 mg via INTRAVENOUS

## 2018-08-06 MED ORDER — MIDAZOLAM HCL 2 MG/2ML IJ SOLN: 0.5000 mg | Freq: Once | INTRAMUSCULAR | Status: DC | PRN

## 2018-08-06 MED ORDER — POVIDONE-IODINE 5 % OP SOLN
OPHTHALMIC | Status: DC | PRN
  Administered 2018-08-06: 1 via OPHTHALMIC

## 2018-08-06 MED ORDER — PROVISC 10 MG/ML IO SOLN
INTRAOCULAR | Status: DC | PRN
  Administered 2018-08-06: 0.85 mL via INTRAOCULAR

## 2018-08-06 MED ORDER — HYDROCODONE-ACETAMINOPHEN 7.5-325 MG PO TABS: 1.0000 | ORAL_TABLET | Freq: Once | ORAL | Status: DC | PRN

## 2018-08-06 MED ORDER — CYCLOPENTOLATE-PHENYLEPHRINE 0.2-1 % OP SOLN
1.0000 [drp] | OPHTHALMIC | Status: AC
  Administered 2018-08-06 (×3): 1 [drp] via OPHTHALMIC

## 2018-08-06 MED ORDER — TETRACAINE HCL 0.5 % OP SOLN
1.0000 [drp] | OPHTHALMIC | Status: AC
  Administered 2018-08-06 (×3): 1 [drp] via OPHTHALMIC

## 2018-08-06 MED ORDER — PHENYLEPHRINE HCL 2.5 % OP SOLN
1.0000 [drp] | OPHTHALMIC | Status: AC
  Administered 2018-08-06 (×3): 1 [drp] via OPHTHALMIC

## 2018-08-06 SURGICAL SUPPLY — 12 items

## 2018-08-06 NOTE — Transfer of Care (Signed)
Immediate Anesthesia Transfer of Care Note  Patient: Angel Barry  Procedure(s) Performed: CATARACT EXTRACTION PHACO AND INTRAOCULAR LENS PLACEMENT (IOC) (Right Eye)  Patient Location: Short Stay  Anesthesia Type:MAC  Level of Consciousness: awake, alert , oriented and patient cooperative  Airway & Oxygen Therapy: Patient Spontanous Breathing  Post-op Assessment: Report given to RN and Post -op Vital signs reviewed and stable  Post vital signs: Reviewed and stable  Last Vitals:  Vitals Value Taken Time  BP    Temp    Pulse    Resp    SpO2      Last Pain:  Vitals:   08/06/18 0719  TempSrc: Oral  PainSc: 0-No pain         Complications: No apparent anesthesia complications

## 2018-08-06 NOTE — Op Note (Signed)
Date of procedure: 08/06/18  Pre-operative diagnosis: Visually significant age-related cataract, Right Eye (H25.811)  Post-operative diagnosis: Visually significant age-related cataract, Right Eye  Procedure: Removal of cataract via phacoemulsification and insertion of intra-ocular lens [employer] PCB00  +20.0D into the capsular bag of the Right Eye  Attending surgeon: Gerda Diss. Maile Linford, MD, MA  Anesthesia: MAC, Topical Akten  Complications: None  Estimated Blood Loss: <59m (minimal)  Specimens: None  Implants: As above  Indications:  Visually significant age-related cataract, Right Eye  Procedure:  The patient was seen and identified in the pre-operative area. The operative eye was identified and dilated.  The operative eye was marked.  Topical anesthesia was administered to the operative eye.     The patient was then to the operative suite and placed in the supine position.  A timeout was performed confirming the patient, procedure to be performed, and all other relevant information.   The patient's face was prepped and draped in the usual fashion for intra-ocular surgery.  A lid speculum was placed into the operative eye and the surgical microscope moved into place and focused.  A superotemporal paracentesis was created using a 20 gauge paracentesis blade.  Shugarcaine was injected into the anterior chamber.  Viscoelastic was injected into the anterior chamber.  A temporal clear-corneal main wound incision was created using a 2.462mmicrokeratome.  A continuous curvilinear capsulorrhexis was initiated using an irrigating cystitome and completed using capsulorrhexis forceps.  Hydrodissection and hydrodeliniation were performed.  Viscoelastic was injected into the anterior chamber.  A phacoemulsification handpiece and a chopper as a second instrument were used to remove the nucleus and epinucleus. The irrigation/aspiration handpiece was used to remove any remaining cortical  material.   The capsular bag was reinflated with viscoelastic, checked, and found to be intact.  The intraocular lens was inserted into the capsular bag and dialed into place using a Kuglen hook.  The irrigation/aspiration handpiece was used to remove any remaining viscoelastic.  The clear corneal wound and paracentesis wounds were then hydrated and checked with Weck-Cels to be watertight.  The lid-speculum and drape was removed, and the patient's face was cleaned with a wet and dry 4x4.  Maxitrol was instilled in the eye before a clear shield was taped over the eye. The patient was taken to the post-operative care unit in good condition, having tolerated the procedure well.  Post-Op Instructions: [facility] and will receive all other orders and instructions.

## 2018-08-06 NOTE — Anesthesia Postprocedure Evaluation (Signed)
Anesthesia Post Note  Patient: Angel Barry  Procedure(s) Performed: CATARACT EXTRACTION PHACO AND INTRAOCULAR LENS PLACEMENT (IOC) (Right Eye)  Patient location during evaluation: Short Stay Anesthesia Type: MAC Level of consciousness: awake, oriented and awake and alert Pain management: pain level controlled Vital Signs Assessment: post-procedure vital signs reviewed and stable Respiratory status: spontaneous breathing Cardiovascular status: stable Postop Assessment: no apparent nausea or vomiting Anesthetic complications: no     Last Vitals:  Vitals:   08/06/18 0719  BP: (!) 161/72  Pulse: 66  Resp: 17  Temp: 36.7 C  SpO2: 98%    Last Pain:  Vitals:   08/06/18 0719  TempSrc: Oral  PainSc: 0-No pain                 ADAMS, AMY A

## 2018-08-06 NOTE — Discharge Instructions (Signed)
Please discharge patient when stable, will follow up today with Dr. Antonia Culbertson at the [facility] office immediately following discharge.  Leave shield in place until visit.  All paperwork with discharge instructions will be given at the office. ° °

## 2018-08-06 NOTE — H&P (Signed)
The H and P was reviewed and updated. The patient was examined.  No changes were found after exam.  The surgical eye was marked.  

## 2018-08-06 NOTE — Anesthesia Procedure Notes (Signed)
Procedure Name: MAC Date/Time: 08/06/2018 8:29 AM Performed by: Andree Elk Amy A, CRNA Pre-anesthesia Checklist: Patient identified, Emergency Drugs available, Suction available, Timeout performed and Patient being monitored Patient Re-evaluated:Patient Re-evaluated prior to induction Oxygen Delivery Method: Nasal Cannula

## 2018-08-06 NOTE — Anesthesia Preprocedure Evaluation (Signed)
Anesthesia Evaluation  Patient identified by MRN, date of birth, ID band Patient awake    Reviewed: Allergy & Precautions, NPO status , Patient's Chart, lab work & pertinent test results  Airway Mallampati: II  TM Distance: >3 FB Neck ROM: Full    Dental no notable dental hx. (+) Teeth Intact   Pulmonary neg pulmonary ROS,    Pulmonary exam normal breath sounds clear to auscultation       Cardiovascular Exercise Tolerance: Good hypertension, Pt. on medications negative cardio ROS Normal cardiovascular examI Rhythm:Regular Rate:Normal     Neuro/Psych negative neurological ROS  negative psych ROS   GI/Hepatic Neg liver ROS, GERD  Controlled,States PRN meds  None in about a month  Slight GERD this am prior to getting her  Resolved without meds  Told her to let us know if it recurs    Endo/Other  negative endocrine ROS  Renal/GU negative Renal ROS  negative genitourinary   Musculoskeletal negative musculoskeletal ROS (+)   Abdominal   Peds negative pediatric ROS (+)  Hematology negative hematology ROS (+)   Anesthesia Other Findings   Reproductive/Obstetrics negative OB ROS                             Anesthesia Physical Anesthesia Plan  ASA: II  Anesthesia Plan: MAC   Post-op Pain Management:    Induction: Intravenous  PONV Risk Score and Plan: 3 and TIVA, Treatment may vary due to age or medical condition and Ondansetron  Airway Management Planned: Simple Face Mask and Nasal Cannula  Additional Equipment:   Intra-op Plan:   Post-operative Plan:   Informed Consent: I have reviewed the patients History and Physical, chart, labs and discussed the procedure including the risks, benefits and alternatives for the proposed anesthesia with the patient or authorized representative who has indicated his/her understanding and acceptance.     Dental advisory given  Plan  Discussed with: CRNA  Anesthesia Plan Comments: (Plan Full PPE use  Plan MAC -had previous L eye done with MAC 12/18/2016)        Anesthesia Quick Evaluation

## 2018-08-09 ENCOUNTER — Encounter (HOSPITAL_COMMUNITY): Payer: Self-pay | Admitting: Ophthalmology

## 2019-06-17 ENCOUNTER — Other Ambulatory Visit (HOSPITAL_COMMUNITY): Payer: Self-pay | Admitting: Family Medicine

## 2019-06-17 DIAGNOSIS — Z1231 Encounter for screening mammogram for malignant neoplasm of breast: Secondary | ICD-10-CM

## 2019-07-04 ENCOUNTER — Ambulatory Visit (HOSPITAL_COMMUNITY)
Admission: RE | Admit: 2019-07-04 | Discharge: 2019-07-04 | Disposition: A | Discharge status: Home / Self Care | Payer: Medicare PPO | Source: Ambulatory Visit | Attending: Family Medicine | Admitting: Family Medicine

## 2019-07-04 ENCOUNTER — Other Ambulatory Visit: Payer: Self-pay

## 2019-07-04 DIAGNOSIS — Z1231 Encounter for screening mammogram for malignant neoplasm of breast: Secondary | ICD-10-CM | POA: Insufficient documentation

## 2020-10-11 ENCOUNTER — Other Ambulatory Visit (HOSPITAL_COMMUNITY): Payer: Self-pay | Admitting: Family Medicine

## 2020-10-11 DIAGNOSIS — Z1231 Encounter for screening mammogram for malignant neoplasm of breast: Secondary | ICD-10-CM

## 2020-10-17 ENCOUNTER — Other Ambulatory Visit: Payer: Self-pay

## 2020-10-17 ENCOUNTER — Ambulatory Visit (HOSPITAL_COMMUNITY)
Admission: RE | Admit: 2020-10-17 | Discharge: 2020-10-17 | Disposition: A | Discharge status: Home / Self Care | Payer: Medicare PPO | Source: Ambulatory Visit | Attending: Family Medicine | Admitting: Family Medicine

## 2020-10-17 DIAGNOSIS — Z1231 Encounter for screening mammogram for malignant neoplasm of breast: Secondary | ICD-10-CM | POA: Insufficient documentation

## 2021-06-25 DIAGNOSIS — H01005 Unspecified blepharitis left lower eyelid: Secondary | ICD-10-CM | POA: Diagnosis not present

## 2021-06-25 DIAGNOSIS — H01002 Unspecified blepharitis right lower eyelid: Secondary | ICD-10-CM | POA: Diagnosis not present

## 2021-06-25 DIAGNOSIS — H00022 Hordeolum internum right lower eyelid: Secondary | ICD-10-CM | POA: Diagnosis not present

## 2021-11-25 DIAGNOSIS — R7303 Prediabetes: Secondary | ICD-10-CM | POA: Diagnosis not present

## 2021-12-11 ENCOUNTER — Other Ambulatory Visit (HOSPITAL_COMMUNITY): Payer: Self-pay | Admitting: Internal Medicine

## 2021-12-11 DIAGNOSIS — Z1231 Encounter for screening mammogram for malignant neoplasm of breast: Secondary | ICD-10-CM

## 2021-12-20 ENCOUNTER — Ambulatory Visit (HOSPITAL_COMMUNITY): Payer: Medicare PPO

## 2021-12-23 ENCOUNTER — Ambulatory Visit (HOSPITAL_COMMUNITY)
Admission: RE | Admit: 2021-12-23 | Discharge: 2021-12-23 | Disposition: A | Discharge status: Home / Self Care | Payer: Medicare PPO | Source: Ambulatory Visit | Attending: Internal Medicine | Admitting: Internal Medicine

## 2021-12-23 DIAGNOSIS — Z1231 Encounter for screening mammogram for malignant neoplasm of breast: Secondary | ICD-10-CM | POA: Insufficient documentation

## 2021-12-31 ENCOUNTER — Other Ambulatory Visit (HOSPITAL_COMMUNITY): Payer: Self-pay | Admitting: Internal Medicine

## 2021-12-31 DIAGNOSIS — N6310 Unspecified lump in the right breast, unspecified quadrant: Secondary | ICD-10-CM

## 2022-01-01 ENCOUNTER — Encounter (HOSPITAL_COMMUNITY): Payer: Self-pay | Admitting: Family Medicine

## 2022-01-06 DIAGNOSIS — Z923 Personal history of irradiation: Secondary | ICD-10-CM

## 2022-01-06 HISTORY — DX: Personal history of irradiation: Z92.3

## 2022-01-07 DIAGNOSIS — Z9221 Personal history of antineoplastic chemotherapy: Secondary | ICD-10-CM

## 2022-01-07 HISTORY — DX: Personal history of antineoplastic chemotherapy: Z92.21

## 2022-01-09 ENCOUNTER — Other Ambulatory Visit (HOSPITAL_COMMUNITY): Payer: Self-pay | Admitting: Internal Medicine

## 2022-01-09 ENCOUNTER — Ambulatory Visit (HOSPITAL_COMMUNITY)
Admission: RE | Admit: 2022-01-09 | Discharge: 2022-01-09 | Disposition: A | Discharge status: Home / Self Care | Payer: Medicare PPO | Source: Ambulatory Visit | Attending: Internal Medicine | Admitting: Internal Medicine

## 2022-01-09 DIAGNOSIS — R928 Other abnormal and inconclusive findings on diagnostic imaging of breast: Secondary | ICD-10-CM

## 2022-01-09 DIAGNOSIS — N6310 Unspecified lump in the right breast, unspecified quadrant: Secondary | ICD-10-CM

## 2022-01-09 DIAGNOSIS — Z1239 Encounter for other screening for malignant neoplasm of breast: Secondary | ICD-10-CM | POA: Diagnosis not present

## 2022-01-09 DIAGNOSIS — N6489 Other specified disorders of breast: Secondary | ICD-10-CM | POA: Diagnosis not present

## 2022-01-14 ENCOUNTER — Other Ambulatory Visit (HOSPITAL_COMMUNITY): Payer: Self-pay | Admitting: Internal Medicine

## 2022-01-14 ENCOUNTER — Ambulatory Visit (HOSPITAL_COMMUNITY)
Admission: RE | Admit: 2022-01-14 | Discharge: 2022-01-14 | Disposition: A | Discharge status: Home / Self Care | Payer: Medicare PPO | Source: Ambulatory Visit | Attending: Internal Medicine | Admitting: Internal Medicine

## 2022-01-14 ENCOUNTER — Encounter (HOSPITAL_COMMUNITY): Payer: Self-pay

## 2022-01-14 DIAGNOSIS — R928 Other abnormal and inconclusive findings on diagnostic imaging of breast: Secondary | ICD-10-CM

## 2022-01-14 DIAGNOSIS — C50411 Malignant neoplasm of upper-outer quadrant of right female breast: Secondary | ICD-10-CM | POA: Diagnosis not present

## 2022-01-14 DIAGNOSIS — C50919 Malignant neoplasm of unspecified site of unspecified female breast: Secondary | ICD-10-CM

## 2022-01-14 HISTORY — DX: Malignant neoplasm of unspecified site of unspecified female breast: C50.919

## 2022-01-14 HISTORY — PX: BREAST BIOPSY: SHX20

## 2022-01-14 MED ORDER — LIDOCAINE HCL (PF) 2 % IJ SOLN
INTRAMUSCULAR | Status: AC
  Filled 2022-01-14: qty 10

## 2022-01-16 LAB — SURGICAL PATHOLOGY

## 2022-01-16 NOTE — Progress Notes (Signed)
Referral received for patient. Referral reviewed with Dr. Delton Coombes. Patient scheduled to see surgeon on 1/18. Patient will be seen by medical oncology approximately 4 weeks after surgery. New patient appointment schedule pending. I have discussed the plan with the patient and she is agreeable. All questions addressed and answered. I have provided the patient with my contact information and encouraged her to call with questions or concerns.

## 2022-01-23 ENCOUNTER — Ambulatory Visit: Payer: Medicare PPO | Admitting: General Surgery

## 2022-01-23 ENCOUNTER — Other Ambulatory Visit (HOSPITAL_COMMUNITY): Payer: Self-pay | Admitting: General Surgery

## 2022-01-23 ENCOUNTER — Encounter: Payer: Self-pay | Admitting: General Surgery

## 2022-01-23 VITALS — BP 141/77 | HR 69 | Temp 98.6°F | Resp 14 | Ht 63.0 in | Wt 133.0 lb

## 2022-01-23 DIAGNOSIS — C50911 Malignant neoplasm of unspecified site of right female breast: Secondary | ICD-10-CM

## 2022-01-23 DIAGNOSIS — Z171 Estrogen receptor negative status [ER-]: Secondary | ICD-10-CM | POA: Diagnosis not present

## 2022-01-23 DIAGNOSIS — R928 Other abnormal and inconclusive findings on diagnostic imaging of breast: Secondary | ICD-10-CM

## 2022-01-23 DIAGNOSIS — C50411 Malignant neoplasm of upper-outer quadrant of right female breast: Secondary | ICD-10-CM | POA: Insufficient documentation

## 2022-01-23 NOTE — Progress Notes (Signed)
[facility] History and Physical  Reason for Referral: Right breast cancer Referring Physician: Moncure Nation, MD   Chief Complaint   New Patient (Initial Visit)     Angel Barry is a 77 y.o. female.  HPI: Angel Barry is a 77 yo who is otherwise healthy female who gets her normal screening mammograms annually and had a spot on her mammogram this year. 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   The patient has no history of any masses, lumps, bumps, nipple changes or discharge.  She has a history of any family breast cancer in a cousin but no first degree relatives. Her son has a glioblastoma and her mother had leukemia at 91.   She has never had any previous biopsies or concerning areas on mammogram.d  She has not had any chest radiation.   Past Medical History:  Diagnosis Date   GERD (gastroesophageal reflux disease)    Hypercholesteremia    Hypertension     Past Surgical History:  Procedure Laterality Date   BREAST BIOPSY Right 01/14/2022   [place] RT BREAST BX W LOC DEV 1ST LESION IMG BX SPEC US GUIDE 01/14/2022 Everlean Alstrom, MD [facility]-UL[facility]AS[facility]   CATARACT EXTRACTION W/PHACO Left 12/18/2016   Procedure: CATARACT EXTRACTION PHACO AND INTRAOCULAR LENS PLACEMENT ([place]);  Surgeon: Baruch Goldmann, MD;  Location: [facility];  Service: Ophthalmology;  Laterality: Left;  CDE: 9.55   CATARACT EXTRACTION W/PHACO Right 08/06/2018   Procedure: CATARACT EXTRACTION PHACO AND INTRAOCULAR LENS PLACEMENT ([facility]);  Surgeon: Baruch Goldmann, MD;  Location: [facility];  Service: Ophthalmology;  Laterality: Right;  CDE: 13.46    Family History  Problem Relation Age of Onset   Atrial fibrillation Mother    Leukemia Mother    Irregular heart beat Father    Brain cancer Son     Social History   Tobacco Use   Smoking status: Never   Smokeless tobacco: Never  Vaping Use   Vaping Use: Never used  Substance Use Topics    Alcohol use: No   Drug use: No    Medications: I have reviewed the patient's current medications. Allergies as of 01/23/2022       Reactions   Macrobid [nitrofurantoin Macrocrystal] Nausea And Vomiting        Medication List        Accurate as of January 23, 2022 11:59 PM. If you have any questions, ask your nurse or doctor.          STOP taking these medications    fosinopril 20 MG tablet Commonly known as: MONOPRIL Stopped by: Virl Cagey, MD   omeprazole 20 MG capsule Commonly known as: PRILOSEC Stopped by: Virl Cagey, MD       TAKE these medications    acetaminophen 500 MG tablet Commonly known as: TYLENOL Take 1,000 mg by mouth daily as needed for moderate pain.   BLINK TEARS OP Apply 2 drops to eye daily.   cetirizine 10 MG tablet Commonly known as: ZYRTEC Take 10 mg by mouth at bedtime.   cholecalciferol 25 MCG (1000 UNIT) tablet Commonly known as: VITAMIN D3 Take 1,000 Units by mouth daily.   ELDERBERRY PO Take by mouth.   famotidine 10 MG tablet Commonly known as: PEPCID Take 10 mg by mouth daily.   fluticasone 50 MCG/ACT nasal spray Commonly known as: FLONASE Place 2 sprays into both nostrils daily.  fosinopril-hydrochlorothiazide 20-12.5 MG tablet Commonly known as: MONOPRIL-HCT Take 1 tablet by mouth daily.   meloxicam 7.5 MG tablet Commonly known as: MOBIC Take 15 mg by mouth daily.   pantoprazole 40 MG tablet Commonly known as: PROTONIX Take 40 mg by mouth daily.   simvastatin 20 MG tablet Commonly known as: ZOCOR Take 10 mg by mouth every evening.   vitamin C 1000 MG tablet Take 1,000 mg by mouth daily.   Zinc 50 MG Tabs Take by mouth.         ROS:  A comprehensive review of systems was negative except for: Gastrointestinal: positive for reflux symptoms Genitourinary: positive for frequency Musculoskeletal: positive for back pain, neck pain, and joint pain Neurological: positive for  vertigo  Blood pressure (!) 141/77, pulse 69, temperature 98.6 F (37 C), temperature source Other (Comment), resp. rate 14, height '5\' 3"'$  (1.6 m), weight 133 lb (60.3 kg), SpO2 97 %. Physical Exam Constitutional:      Appearance: Normal appearance.  HENT:     Head: Normocephalic.     Nose: Nose normal.     Mouth/Throat:     Mouth: Mucous membranes are moist.  Eyes:     Extraocular Movements: Extraocular movements intact.  Cardiovascular:     Rate and Rhythm: Normal rate and regular rhythm.  Pulmonary:     Effort: Pulmonary effort is normal.     Breath sounds: Normal breath sounds.  Chest:  Breasts:    Right: Tenderness present. No inverted nipple, mass, nipple discharge or skin change.     Left: No inverted nipple, mass, nipple discharge, skin change or tenderness.     Comments: Bruising at biopsy site right lateral breast Abdominal:     General: There is no distension.     Palpations: Abdomen is soft.     Tenderness: There is no abdominal tenderness.  Musculoskeletal:        General: Normal range of motion.     Cervical back: Normal range of motion.  Lymphadenopathy:     Upper Body:     Right upper body: No supraclavicular or axillary adenopathy.     Left upper body: No supraclavicular or axillary adenopathy.  Skin:    General: Skin is warm.  Neurological:     General: No focal deficit present.     Mental Status: She is alert and oriented to person, place, and time.  Psychiatric:        Mood and Affect: Mood normal.        Behavior: Behavior normal.        Thought Content: Thought content normal.        Judgment: Judgment normal.     Results: Personally reviewed- area in right upper outer breast, clip marking biopsy site noted  CLINICAL DATA:  Screening.   EXAM: DIGITAL SCREENING BILATERAL MAMMOGRAM WITH TOMOSYNTHESIS AND CAD   TECHNIQUE: Bilateral screening digital craniocaudal and mediolateral oblique mammograms were obtained. Bilateral screening digital  breast tomosynthesis was performed. The images were evaluated with computer-aided detection.   COMPARISON:  Previous exam(s).   ACR Breast Density Category c: The breast tissue is heterogeneously dense, which may obscure small masses.   FINDINGS: In the right breast, a possible mass warrants further evaluation. In the left breast, no findings suspicious for malignancy.   IMPRESSION: Further evaluation is suggested for a possible mass in the right breast.   RECOMMENDATION: Diagnostic mammogram and possibly ultrasound of the right breast. (Code:FI-R-8M)   The patient will be contacted  regarding the findings, and additional imaging will be scheduled.   BI-RADS CATEGORY  0: Incomplete. Need additional imaging evaluation and/or prior mammograms for comparison.     Electronically Signed   By: Lovey Newcomer M.D.   On: 12/25/2021 05:45  CLINICAL DATA:  Screening recall for a possible right breast mass.   EXAM: DIGITAL DIAGNOSTIC UNILATERAL RIGHT MAMMOGRAM WITH TOMOSYNTHESIS; ULTRASOUND RIGHT BREAST LIMITED   TECHNIQUE: Right digital diagnostic mammography and breast tomosynthesis was performed.; Targeted ultrasound examination of the right breast was performed   COMPARISON:  Previous exam(s).   ACR Breast Density Category b: There are scattered areas of fibroglandular density.   FINDINGS: On the diagnostic spot-compression images, the possible mass persists as a small, 7 mm, irregular mass in the upper outer right breast.   On physical exam, no mass is palpated in the upper outer right breast.   Targeted ultrasound is performed, showing a hypoechoic irregular mass in the right breast at 11 o'clock, 7 cm the nipple, measuring 8 x 6 x 7 mm, consistent in size, shape and location to mammographic mass. There is no internal blood flow on color Doppler analysis. Sonographic imaging of the right axilla demonstrates normal lymph nodes. There are no enlarged or abnormal  lymph nodes.   IMPRESSION: 1. Suspicious, 8 mm mass in the right breast at 11 o'clock, 7 cm the nipple. Tissue sampling recommended.   RECOMMENDATION: 1. Ultrasound-guided core needle biopsy the right breast mass.   I have discussed the findings and recommendations with the patient. If applicable, a reminder letter will be sent to the patient regarding the next appointment.   BI-RADS CATEGORY  4: Suspicious.     Electronically Signed   By: Lajean Manes M.D.   On: 01/09/2022 15:08  CLINICAL DATA:  Patient presents for ultrasound-guided core biopsy of a 0.8 cm mass in the right breast at 11 o'clock 7 cm from nipple.   EXAM: ULTRASOUND GUIDED RIGHT BREAST CORE NEEDLE BIOPSY   COMPARISON:  Previous exam(s).   PROCEDURE: I met with the patient and we discussed the procedure of ultrasound-guided biopsy, including benefits and alternatives. We discussed the high likelihood of a successful procedure. We discussed the risks of the procedure, including infection, bleeding, tissue injury, clip migration, and inadequate sampling. Informed written consent was given. The usual time-out protocol was performed immediately prior to the procedure.   Lesion quadrant: Upper outer   Using sterile technique and 1% Lidocaine as local anesthetic, under direct ultrasound visualization, a 14 gauge spring-loaded device was used to perform biopsy of the mass in the right breast the 11 position using a lateral to medial approach. At the conclusion of the procedure a ribbon shaped tissue marker clip was deployed into the biopsy cavity. Follow up 2 view mammogram was performed and dictated separately.   IMPRESSION: Ultrasound guided biopsy of the mass in the right breast at the 11 o'clock position. No apparent complications.   Electronically Signed: By: Everlean Alstrom M.D. On: 01/14/2022 13:36   ADDENDUM REPORT: 01/15/2022 14:12   ADDENDUM: PATHOLOGY revealed: A. RIGHT BREAST, MASS @  11:00, BIOPSY: Invasive poorly differentiated ductal adenocarcinoma, grade 3 (3+3+3). Overall grade: Grade 3 (9/9). Negative for microcalcifications. Negative for lymphovascular invasion. Tumor measures 6 mm in greatest linear extent.   Pathology results are CONCORDANT with imaging findings, per Dr. Everlean Alstrom.   Pathology results and recommendations were discussed with patient via telephone on 01/15/2022. Patient reported biopsy site doing well with no adverse symptoms, and  only slight tenderness at the site. Post biopsy care instructions were reviewed, questions were answered and my direct phone number was provided. Patient was instructed to call [facility] Mammography Department for any additional questions or concerns related to biopsy site.   RECOMMENDATION: Surgical and oncological consultation. Request for surgical and oncological consultation was relayed to Kathi Der RT at [facility] Mammography Department by Electa Sniff RN on 01/15/2022.   Pathology results reported by Electa Sniff RN on 01/15/2022.     Electronically Signed   By: Everlean Alstrom M.D.   On: 01/15/2022 14:12  Assessment & Plan:  Angel Barry is a 77 y.o. female with an invasive right breast cancer that is ER/PR negative on her initial biopsy. Given this she will need a sentinel node.   We have discussed the options for surgery including the option of mastectomy with sentinel node biopsy versus partial mastectomy (lumpectomy) with sentinel node biopsy. We have discussed that there is no difference in the prognosis or chance or recurrence or differences in survival between the two options. We have discussed the need for radiation with the lumpectomy, and we have discussed that she will be referred to oncology after our procedure to further discuss her options for chemotherapy and hormonal therapy if she qualifies.   We have discussed that if she decides to have a  lumpectomy that we will need to get a needle placed into the area where the biopsy was performed, since we cannot palpate a mass. We have also discussed the need for injection of radiotracer and blue dye to perform the sentinel node biopsy.  We have discussed that the sentinel node biopsy tells [place] if the cancer has spread to the lymph nodes, and can help with plans for chemotherapy treatment and overall prognosis.    We have discussed that if the lumpectomy does not remove the entire cancer that she may have to have an additional procedure, and we have discussed that a positive sentinel node can require further removal of lymph nodes from the axilla but that recent research does not show any improvement in disease free survival and carries greater risk for lymphedema.    We have discussed that these are big discussions, and that the risk from the operations are similar including risk of bleeding, risk of infection, and risk of needing additional surgeries. We have discussed the likely need for an overnight stay with a mastectomy and a drain that will remain in place for about 1 week.     She will proceed with lumpectomy after radiofrequency tag placement and sentinel lymph node biopsy.    All questions were answered to the satisfaction of the patient and family.     Virl Cagey 01/27/2022, 10:09 AM

## 2022-01-27 ENCOUNTER — Encounter: Payer: Self-pay | Admitting: General Surgery

## 2022-01-27 NOTE — H&P (Signed)
[facility] History and Physical   Reason for Referral: Right breast cancer Referring Physician: Safety [place] Nation, MD     Chief Complaint   New Patient (Initial Visit)        Angel Barry is a 77 y.o. female.  HPI: Angel Barry is a 77 yo who is otherwise healthy female who gets her normal screening mammograms annually and had a spot on her mammogram this year. 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    The patient has no history of any masses, lumps, bumps, nipple changes or discharge.  She has a history of any family breast cancer in a cousin but no first degree relatives. Her son has a glioblastoma and her mother had leukemia at 108.   She has never had any previous biopsies or concerning areas on mammogram.d  She has not had any chest radiation.         Past Medical History:  Diagnosis Date   GERD (gastroesophageal reflux disease)     Hypercholesteremia     Hypertension             Past Surgical History:  Procedure Laterality Date   BREAST BIOPSY Right 01/14/2022    [place] RT BREAST BX W LOC DEV 1ST LESION IMG BX SPEC US GUIDE 01/14/2022 Everlean Alstrom, MD [facility]-UL[facility]ASOUND   CATARACT EXTRACTION W/PHACO Left 12/18/2016    Procedure: CATARACT EXTRACTION PHACO AND INTRAOCULAR LENS PLACEMENT ([place]);  Surgeon: Baruch Goldmann, MD;  Location: [facility];  Service: Ophthalmology;  Laterality: Left;  CDE: 9.55   CATARACT EXTRACTION W/PHACO Right 08/06/2018    Procedure: CATARACT EXTRACTION PHACO AND INTRAOCULAR LENS PLACEMENT ([facility]);  Surgeon: Baruch Goldmann, MD;  Location: [facility];  Service: Ophthalmology;  Laterality: Right;  CDE: 13.46           Family History  Problem Relation Age of Onset   Atrial fibrillation Mother     Leukemia Mother     Irregular heart beat Father     Brain cancer Son        Social History        Tobacco Use   Smoking status: Never   Smokeless tobacco: Never  Vaping Use    Vaping Use: Never used  Substance Use Topics   Alcohol use: No   Drug use: No      Medications: I have reviewed the patient's current medications. Allergies as of 01/23/2022         Reactions    Macrobid [nitrofurantoin Macrocrystal] Nausea And Vomiting            Medication List           Accurate as of January 23, 2022 11:59 PM. If you have any questions, ask your nurse or doctor.              STOP taking these medications     fosinopril 20 MG tablet Commonly known as: MONOPRIL Stopped by: Virl Cagey, MD    omeprazole 20 MG capsule Commonly known as: PRILOSEC Stopped by: Virl Cagey, MD           TAKE these medications     acetaminophen 500 MG tablet Commonly known as: TYLENOL Take 1,000 mg by mouth daily as needed for moderate pain.    BLINK TEARS OP Apply 2 drops to eye daily.    cetirizine 10 MG tablet Commonly known as: ZYRTEC  Take 10 mg by mouth at bedtime.    cholecalciferol 25 MCG (1000 UNIT) tablet Commonly known as: VITAMIN D3 Take 1,000 Units by mouth daily.    ELDERBERRY PO Take by mouth.    famotidine 10 MG tablet Commonly known as: PEPCID Take 10 mg by mouth daily.    fluticasone 50 MCG/ACT nasal spray Commonly known as: FLONASE Place 2 sprays into both nostrils daily.    fosinopril-hydrochlorothiazide 20-12.5 MG tablet Commonly known as: MONOPRIL-HCT Take 1 tablet by mouth daily.    meloxicam 7.5 MG tablet Commonly known as: MOBIC Take 15 mg by mouth daily.    pantoprazole 40 MG tablet Commonly known as: PROTONIX Take 40 mg by mouth daily.    simvastatin 20 MG tablet Commonly known as: ZOCOR Take 10 mg by mouth every evening.    vitamin C 1000 MG tablet Take 1,000 mg by mouth daily.    Zinc 50 MG Tabs Take by mouth.               ROS:  A comprehensive review of systems was negative except for: Gastrointestinal: positive for reflux symptoms Genitourinary: positive for  frequency Musculoskeletal: positive for back pain, neck pain, and joint pain Neurological: positive for vertigo   Blood pressure (!) 141/77, pulse 69, temperature 98.6 F (37 C), temperature source Other (Comment), resp. rate 14, height '5\' 3"'$  (1.6 m), weight 133 lb (60.3 kg), SpO2 97 %. Physical Exam Constitutional:      Appearance: Normal appearance.  HENT:     Head: Normocephalic.     Nose: Nose normal.     Mouth/Throat:     Mouth: Mucous membranes are moist.  Eyes:     Extraocular Movements: Extraocular movements intact.  Cardiovascular:     Rate and Rhythm: Normal rate and regular rhythm.  Pulmonary:     Effort: Pulmonary effort is normal.     Breath sounds: Normal breath sounds.  Chest:  Breasts:    Right: Tenderness present. No inverted nipple, mass, nipple discharge or skin change.     Left: No inverted nipple, mass, nipple discharge, skin change or tenderness.     Comments: Bruising at biopsy site right lateral breast Abdominal:     General: There is no distension.     Palpations: Abdomen is soft.     Tenderness: There is no abdominal tenderness.  Musculoskeletal:        General: Normal range of motion.     Cervical back: Normal range of motion.  Lymphadenopathy:     Upper Body:     Right upper body: No supraclavicular or axillary adenopathy.     Left upper body: No supraclavicular or axillary adenopathy.  Skin:    General: Skin is warm.  Neurological:     General: No focal deficit present.     Mental Status: She is alert and oriented to person, place, and time.  Psychiatric:        Mood and Affect: Mood normal.        Behavior: Behavior normal.        Thought Content: Thought content normal.        Judgment: Judgment normal.        Results: Personally reviewed- area in right upper outer breast, clip marking biopsy site noted  CLINICAL DATA:  Screening.   EXAM: DIGITAL SCREENING BILATERAL MAMMOGRAM WITH TOMOSYNTHESIS AND CAD   TECHNIQUE: Bilateral  screening digital craniocaudal and mediolateral oblique mammograms were obtained. Bilateral screening digital breast tomosynthesis was  performed. The images were evaluated with computer-aided detection.   COMPARISON:  Previous exam(s).   ACR Breast Density Category c: The breast tissue is heterogeneously dense, which may obscure small masses.   FINDINGS: In the right breast, a possible mass warrants further evaluation. In the left breast, no findings suspicious for malignancy.   IMPRESSION: Further evaluation is suggested for a possible mass in the right breast.   RECOMMENDATION: Diagnostic mammogram and possibly ultrasound of the right breast. (Code:FI-R-22M)   The patient will be contacted regarding the findings, and additional imaging will be scheduled.   BI-RADS CATEGORY  0: Incomplete. Need additional imaging evaluation and/or prior mammograms for comparison.     Electronically Signed   By: Lovey Newcomer M.D.   On: 12/25/2021 05:45   CLINICAL DATA:  Screening recall for a possible right breast mass.   EXAM: DIGITAL DIAGNOSTIC UNILATERAL RIGHT MAMMOGRAM WITH TOMOSYNTHESIS; ULTRASOUND RIGHT BREAST LIMITED   TECHNIQUE: Right digital diagnostic mammography and breast tomosynthesis was performed.; Targeted ultrasound examination of the right breast was performed   COMPARISON:  Previous exam(s).   ACR Breast Density Category b: There are scattered areas of fibroglandular density.   FINDINGS: On the diagnostic spot-compression images, the possible mass persists as a small, 7 mm, irregular mass in the upper outer right breast.   On physical exam, no mass is palpated in the upper outer right breast.   Targeted ultrasound is performed, showing a hypoechoic irregular mass in the right breast at 11 o'clock, 7 cm the nipple, measuring 8 x 6 x 7 mm, consistent in size, shape and location to mammographic mass. There is no internal blood flow on color Doppler  analysis. Sonographic imaging of the right axilla demonstrates normal lymph nodes. There are no enlarged or abnormal lymph nodes.   IMPRESSION: 1. Suspicious, 8 mm mass in the right breast at 11 o'clock, 7 cm the nipple. Tissue sampling recommended.   RECOMMENDATION: 1. Ultrasound-guided core needle biopsy the right breast mass.   I have discussed the findings and recommendations with the patient. If applicable, a reminder letter will be sent to the patient regarding the next appointment.   BI-RADS CATEGORY  4: Suspicious.     Electronically Signed   By: Lajean Manes M.D.   On: 01/09/2022 15:08   CLINICAL DATA:  Patient presents for ultrasound-guided core biopsy of a 0.8 cm mass in the right breast at 11 o'clock 7 cm from nipple.   EXAM: ULTRASOUND GUIDED RIGHT BREAST CORE NEEDLE BIOPSY   COMPARISON:  Previous exam(s).   PROCEDURE: I met with the patient and we discussed the procedure of ultrasound-guided biopsy, including benefits and alternatives. We discussed the high likelihood of a successful procedure. We discussed the risks of the procedure, including infection, bleeding, tissue injury, clip migration, and inadequate sampling. Informed written consent was given. The usual time-out protocol was performed immediately prior to the procedure.   Lesion quadrant: Upper outer   Using sterile technique and 1% Lidocaine as local anesthetic, under direct ultrasound visualization, a 14 gauge spring-loaded device was used to perform biopsy of the mass in the right breast the 11 position using a lateral to medial approach. At the conclusion of the procedure a ribbon shaped tissue marker clip was deployed into the biopsy cavity. Follow up 2 view mammogram was performed and dictated separately.   IMPRESSION: Ultrasound guided biopsy of the mass in the right breast at the 11 o'clock position. No apparent complications.   Electronically Signed: By: Anderson Malta  Shelly Bombard  M.D. On: 01/14/2022 13:36   ADDENDUM REPORT: 01/15/2022 14:12   ADDENDUM: PATHOLOGY revealed: A. RIGHT BREAST, MASS @ 11:00, BIOPSY: Invasive poorly differentiated ductal adenocarcinoma, grade 3 (3+3+3). Overall grade: Grade 3 (9/9). Negative for microcalcifications. Negative for lymphovascular invasion. Tumor measures 6 mm in greatest linear extent.   Pathology results are CONCORDANT with imaging findings, per Dr. Everlean Alstrom.   Pathology results and recommendations were discussed with patient via telephone on 01/15/2022. Patient reported biopsy site doing well with no adverse symptoms, and only slight tenderness at the site. Post biopsy care instructions were reviewed, questions were answered and my direct phone number was provided. Patient was instructed to call [facility] Mammography Department for any additional questions or concerns related to biopsy site.   RECOMMENDATION: Surgical and oncological consultation. Request for surgical and oncological consultation was relayed to Kathi Der RT at [facility] Mammography Department by Electa Sniff RN on 01/15/2022.   Pathology results reported by Electa Sniff RN on 01/15/2022.     Electronically Signed   By: Everlean Alstrom M.D.   On: 01/15/2022 14:12   Assessment & Plan:  Angel Barry is a 77 y.o. female with an invasive right breast cancer that is ER/PR negative on her initial biopsy. Given this she will need a sentinel node.    We have discussed the options for surgery including the option of mastectomy with sentinel node biopsy versus partial mastectomy (lumpectomy) with sentinel node biopsy. We have discussed that there is no difference in the prognosis or chance or recurrence or differences in survival between the two options. We have discussed the need for radiation with the lumpectomy, and we have discussed that she will be referred to oncology after our procedure to further discuss  her options for chemotherapy and hormonal therapy if she qualifies.    We have discussed that if she decides to have a lumpectomy that we will need to get a needle placed into the area where the biopsy was performed, since we cannot palpate a mass. We have also discussed the need for injection of radiotracer and blue dye to perform the sentinel node biopsy.   We have discussed that the sentinel node biopsy tells [place] if the cancer has spread to the lymph nodes, and can help with plans for chemotherapy treatment and overall prognosis.     We have discussed that if the lumpectomy does not remove the entire cancer that she may have to have an additional procedure, and we have discussed that a positive sentinel node can require further removal of lymph nodes from the axilla but that recent research does not show any improvement in disease free survival and carries greater risk for lymphedema.     We have discussed that these are big discussions, and that the risk from the operations are similar including risk of bleeding, risk of infection, and risk of needing additional surgeries. We have discussed the likely need for an overnight stay with a mastectomy and a drain that will remain in place for about 1 week.       She will proceed with lumpectomy after radiofrequency tag placement and sentinel lymph node biopsy.      All questions were answered to the satisfaction of the patient and family.         Virl Cagey 01/27/2022, 10:09 AM

## 2022-01-31 NOTE — Patient Instructions (Addendum)
Angel Barry  01/31/2022     '@PREFPERIOPPHARMACY'$ @   Your procedure is scheduled on 02/05/2022.  Report to [facility] at 8:10 A.M.  Call this number if you have problems the morning of surgery:  [phone]  If you experience any cold or flu symptoms such as cough, fever, chills, shortness of breath, etc. between now and your scheduled surgery, please notify us at the above number.   Remember:  Do not eat or drink after midnight.     Take these medicines the morning of surgery with A SIP OF WATER : Zyrtec Flonase Pantoprazole    Do not wear jewelry, make-up or nail polish.  Do not wear lotions, powders, or perfumes, or deodorant.  Do not shave 48 hours prior to surgery.  Men may shave face and neck.  Do not bring valuables to the hospital.  [facility] is not responsible for any belongings or valuables.  Contacts, dentures or bridgework may not be worn into surgery.  Leave your suitcase in the car.  After surgery it may be brought to your room.  For patients admitted to the hospital, discharge time will be determined by your treatment team.  Patients discharged the day of surgery will not be allowed to drive home.   Name and phone number of your driver:   Family Special instructions:  N/A  Please read over the following fact sheets that you were given. Care and Recovery After Surgery  Lumpectomy  A lumpectomy, sometimes called a partial mastectomy, is surgery to remove a cancerous tumor or mass (the lump) from a breast. It is a form of breast-conserving or breast-preservation surgery. This means that the cancerous tissue is removed but the breast remains intact. During a lumpectomy, the portion of the breast that contains the tumor is removed. Lymph nodes under your arm may also be removed. Lymph nodes are part of the body's disease-fighting system (immune system) and are usually the first place where breast cancer spreads. Tell a health care provider about: Any  allergies you have. All medicines you are taking, including vitamins, herbs, eye drops, creams, and over-the-counter medicines. Any problems you or family members have had with anesthetic medicines. Any bleeding problems you have. Any surgeries you have had. Any medical conditions you have. Whether you are pregnant or may be pregnant. What are the risks? Generally, this is a safe procedure. However, problems may occur, including: Bleeding. Infection. Allergic reaction to medicines. Pain, swelling, weakness, or numbness in the arm on the side of your surgery. Temporary swelling. Change in the shape of the breast, especially if a large portion is removed. Scar tissue that forms at the surgical site and feels hard to the touch. Blood clots. What happens before the procedure? When to stop eating and drinking Follow instructions from your health care provider about what you may eat and drink. These may include: 8 hours before your procedure Stop eating most foods. Do not eat meat, fried foods, or fatty foods. Eat only light foods, such as toast or crackers. All liquids are okay except energy drinks and alcohol. 6 hours before your procedure Stop eating. Drink only clear liquids, such as water, clear fruit juice, black coffee, plain tea, and sports drinks. Do not drink energy drinks or alcohol. 2 hours before your procedure Stop drinking all liquids. You may be allowed to take medicines with small sips of water. If you do not follow your health care provider's instructions, your procedure may be delayed or canceled.  Medicines Ask your health care provider about: Changing or stopping your regular medicines. These include any diabetes medicines or blood thinners you take. Taking medicines such as aspirin and ibuprofen. These medicines can thin your blood. Do not take them unless your health care provider tells you to. Taking over-the-counter medicines, vitamins, herbs, and  supplements. Surgery safety Ask your health care provider how your surgery site will be marked. A procedure may be done to locate and mark the tumor area in the breast (localization). This will guide the surgeon to where the incision will be made. This may be done with: Imaging, such as a mammogram, ultrasound, or MRI. Insertion of a small wire, clip, or seed, or an implant that will reflect a radar signal. Also, ask what steps will be taken to help prevent infection. These may include: Washing skin with a germ-killing soap. Taking antibiotic medicine. General instructions You may have screening tests or exams to get normal measurements of your arm, also called baseline measurements. These can be compared to measurements done after surgery to monitor for swelling (lymphedema) that can develop after having lymph nodes removed. If you will be going home right after the procedure, plan to have a responsible adult: Take you home from the hospital or clinic. You will not be allowed to drive. Care for you for the time you are told. What happens during the procedure?  An IV will be inserted into one of your veins. You may be given: A sedative. This helps you relax. Anesthesia. This will: Numb certain areas of your body. Make you fall asleep for surgery. An electric scalpel will be used to reduce bleeding (electrocautery knife). A curved incision that follows the natural curve of your breast will be made. The tumor will be removed along with some of the tissue around it. This will be sent to the lab for testing. Your health care provider may also remove lymph nodes at this time if needed. If the tumor is close to the muscles over your chest, some muscle tissue may also be removed. A small drain tube may be inserted into your breast area or armpit to collect fluid that may build up after surgery. This tube will be connected to a suction bulb on the outside of your body to remove the fluid. The  incision will be closed with stitches (sutures). A bandage (dressing) may be placed over the incision. The procedure may vary among health care providers and hospitals. What happens after the procedure? Your blood pressure, heart rate, breathing rate, and blood oxygen level will be monitored until you leave the hospital or clinic. You will be given medicine for pain as needed. You will be encouraged to get up and walk as soon as you can. This will improve blood flow and breathing. Ask for help if you feel weak or unsteady. You may have a drain tube in place for 2-3 days to prevent a collection of blood (hematoma) from developing in the breast. You may have a pressure bandage applied for 1-2 days to prevent bleeding or swelling. Ask your health care provider how to care for your bandage at home. You may be given a tight sleeve to wear over your arm on the side of your surgery. Wear the sleeve as told by your health care provider. Do not drive or operate machinery until your health care provider says that it is safe. Where to find more information American Cancer Society: cancer.[place]: cancer.gov Summary A lumpectomy, sometimes called  a partial mastectomy, is surgery to remove a cancerous tumor or mass (the lump) from a breast. During a lumpectomy, the portion of the breast that contains the tumor is removed. Plan to have someone take you home from the hospital or clinic. You may have a drain tube in place for 2-3 days to prevent a collection of blood (hematoma) from developing in the breast. This information is not intended to replace advice given to you by your health care provider. Make sure you discuss any questions you have with your health care provider. Document Revised: 03/18/2021 Document Reviewed: 03/03/2021 Elsevier Patient Education  [place] Anesthesia, Adult General anesthesia is the use of medicine to make you fall asleep (unconscious)  for a medical procedure. General anesthesia must be used for certain procedures. It is often recommended for surgery or procedures that: Last a long time. Require you to be still or in an unusual position. Are major and can cause blood loss. Affect your breathing. The medicines used for general anesthesia are called general anesthetics. During general anesthesia, these medicines are given along with medicines that: Prevent pain. Control your blood pressure. Relax your muscles. Prevent nausea and vomiting after the procedure. Tell a health care provider about: Any allergies you have. All medicines you are taking, including vitamins, herbs, eye drops, creams, and over-the-counter medicines. Your history of any: Medical conditions you have, including: High blood pressure. Bleeding problems. Diabetes. Heart or lung conditions, such as: Heart failure. Sleep apnea. Asthma. Chronic obstructive pulmonary disease (COPD). Current or recent illnesses, such as: Upper respiratory, chest, or ear infections. Cough or fever. Tobacco or drug use, including marijuana or alcohol use. Depression or anxiety. Surgeries and types of anesthetics you have had. Problems you or family members have had with anesthetic medicines. Whether you are pregnant or may be pregnant. Whether you have any chipped or loose teeth, dentures, caps, bridgework, or issues with your mouth, swallowing, or choking. What are the risks? Your health care provider will talk with you about risks. These may include: Allergic reaction to the medicines. Lung and heart problems. Inhaling food or liquid from the stomach into the lungs (aspiration). Nerve injury. Injury to the lips, mouth, teeth, or gums. Stroke. Waking up during your procedure and being unable to move. This is rare. These problems are more likely to develop if you are having a major surgery or if you have an advanced or serious medical condition. You can prevent some  of these complications by answering all of your health care provider's questions thoroughly and by following all instructions before your procedure. General anesthesia can cause side effects, including: Nausea or vomiting. A sore throat or hoarseness from the breathing tube. Wheezing or coughing. Shaking chills or feeling cold. Body aches. Sleepiness. Confusion, agitation (delirium), or anxiety. What happens before the procedure? When to stop eating and drinking Follow instructions from your health care provider about what you may eat and drink before your procedure. If you do not follow your health care provider's instructions, your procedure may be delayed or canceled. Medicines Ask your health care provider about: Changing or stopping your regular medicines. These include any diabetes medicines or blood thinners you take. Taking medicines such as aspirin and ibuprofen. These medicines can thin your blood. Do not take them unless your health care provider tells you to. Taking over-the-counter medicines, vitamins, herbs, and supplements. General instructions Do not use any products that contain nicotine or tobacco for at least 4 weeks before the  procedure. These products include cigarettes, chewing tobacco, and vaping devices, such as e-cigarettes. If you need help quitting, ask your health care provider. If you brush your teeth on the morning of the procedure, make sure to spit out all of the water and toothpaste. If told by your health care provider, bring your sleep apnea device with you to surgery (if applicable). If you will be going home right after the procedure, plan to have a responsible adult: Take you home from the hospital or clinic. You will not be allowed to drive. Care for you for the time you are told. What happens during the procedure?  An IV will be inserted into one of your veins. You will be given one or more of the following through a face mask or IV: A sedative.  This helps you relax. Anesthesia. This will: Numb certain areas of your body. Make you fall asleep for surgery. After you are unconscious, a breathing tube may be inserted down your throat to help you breathe. This will be removed before you wake up. An anesthesia provider, such as an anesthesiologist, will stay with you throughout your procedure. The anesthesia provider will: Keep you comfortable and safe by continuing to give you medicines and adjusting the amount of medicine that you get. Monitor your blood pressure, heart rate, and oxygen levels to make sure that the anesthetics do not cause any problems. The procedure may vary among health care providers and hospitals. What happens after the procedure? Your blood pressure, temperature, heart rate, breathing rate, and blood oxygen level will be monitored until you leave the hospital or clinic. You will wake up in a recovery area. You may wake up slowly. You may be given medicine to help you with pain, nausea, or any other side effects from the anesthesia. Summary General anesthesia is the use of medicine to make you fall asleep (unconscious) for a medical procedure. Follow your health care provider's instructions about when to stop eating, drinking, or taking certain medicines before your procedure. Plan to have a responsible adult take you home from the hospital or clinic. This information is not intended to replace advice given to you by your health care provider. Make sure you discuss any questions you have with your health care provider. Document Revised: 03/21/2021 Document Reviewed: 03/21/2021 Elsevier Patient Education  [place].  How to Use Chlorhexidine Before Surgery Chlorhexidine gluconate (CHG) is a germ-killing (antiseptic) solution that is used to clean the skin. It can get rid of the bacteria that normally live on the skin and can keep them away for about 24 hours. To clean your skin with CHG, you may be given: A  CHG solution to use in the shower or as part of a sponge bath. A prepackaged cloth that contains CHG. Cleaning your skin with CHG may help lower the risk for infection: While you are staying in the intensive care unit of the hospital. If you have a vascular access, such as a central line, to provide short-term or long-term access to your veins. If you have a catheter to drain urine from your bladder. If you are on a ventilator. A ventilator is a machine that helps you breathe by moving air in and out of your lungs. After surgery. What are the risks? Risks of using CHG include: A skin reaction. Hearing loss, if CHG gets in your ears and you have a perforated eardrum. Eye injury, if CHG gets in your eyes and is not rinsed out. The CHG  product catching fire. Make sure that you avoid smoking and flames after applying CHG to your skin. Do not use CHG: If you have a chlorhexidine allergy or have previously reacted to chlorhexidine. On babies younger than 75 months of age. How to use CHG solution Use CHG only as told by your health care provider, and follow the instructions on the label. Use the full amount of CHG as directed. Usually, this is one bottle. During a shower Follow these steps when using CHG solution during a shower (unless your health care provider gives you different instructions): Start the shower. Use your normal soap and shampoo to wash your face and hair. Turn off the shower or move out of the shower stream. Pour the CHG onto a clean washcloth. Do not use any type of brush or rough-edged sponge. Starting at your neck, lather your body down to your toes. Make sure you follow these instructions: If you will be having surgery, pay special attention to the part of your body where you will be having surgery. Scrub this area for at least 1 minute. Do not use CHG on your head or face. If the solution gets into your ears or eyes, rinse them well with water. Avoid your genital  area. Avoid any areas of skin that have broken skin, cuts, or scrapes. Scrub your back and under your arms. Make sure to wash skin folds. Let the lather sit on your skin for 1-2 minutes or as long as told by your health care provider. Thoroughly rinse your entire body in the shower. Make sure that all body creases and crevices are rinsed well. Dry off with a clean towel. Do not put any substances on your body afterward--such as powder, lotion, or perfume--unless you are told to do so by your health care provider. Only use lotions that are recommended by the manufacturer. Put on clean clothes or pajamas. If it is the night before your surgery, sleep in clean sheets.  During a sponge bath Follow these steps when using CHG solution during a sponge bath (unless your health care provider gives you different instructions): Use your normal soap and shampoo to wash your face and hair. Pour the CHG onto a clean washcloth. Starting at your neck, lather your body down to your toes. Make sure you follow these instructions: If you will be having surgery, pay special attention to the part of your body where you will be having surgery. Scrub this area for at least 1 minute. Do not use CHG on your head or face. If the solution gets into your ears or eyes, rinse them well with water. Avoid your genital area. Avoid any areas of skin that have broken skin, cuts, or scrapes. Scrub your back and under your arms. Make sure to wash skin folds. Let the lather sit on your skin for 1-2 minutes or as long as told by your health care provider. Using a different clean, wet washcloth, thoroughly rinse your entire body. Make sure that all body creases and crevices are rinsed well. Dry off with a clean towel. Do not put any substances on your body afterward--such as powder, lotion, or perfume--unless you are told to do so by your health care provider. Only use lotions that are recommended by the manufacturer. Put on clean  clothes or pajamas. If it is the night before your surgery, sleep in clean sheets. How to use CHG prepackaged cloths Only use CHG cloths as told by your health care provider, and follow the  instructions on the label. Use the CHG cloth on clean, dry skin. Do not use the CHG cloth on your head or face unless your health care provider tells you to. When washing with the CHG cloth: Avoid your genital area. Avoid any areas of skin that have broken skin, cuts, or scrapes. Before surgery Follow these steps when using a CHG cloth to clean before surgery (unless your health care provider gives you different instructions): Using the CHG cloth, vigorously scrub the part of your body where you will be having surgery. Scrub using a back-and-forth motion for 3 minutes. The area on your body should be completely wet with CHG when you are done scrubbing. Do not rinse. Discard the cloth and let the area air-dry. Do not put any substances on the area afterward, such as powder, lotion, or perfume. Put on clean clothes or pajamas. If it is the night before your surgery, sleep in clean sheets.  For general bathing Follow these steps when using CHG cloths for general bathing (unless your health care provider gives you different instructions). Use a separate CHG cloth for each area of your body. Make sure you wash between any folds of skin and between your fingers and toes. Wash your body in the following order, switching to a new cloth after each step: The front of your neck, shoulders, and chest. Both of your arms, under your arms, and your hands. Your stomach and groin area, avoiding the genitals. Your right leg and foot. Your left leg and foot. The back of your neck, your back, and your buttocks. Do not rinse. Discard the cloth and let the area air-dry. Do not put any substances on your body afterward--such as powder, lotion, or perfume--unless you are told to do so by your health care provider. Only use  lotions that are recommended by the manufacturer. Put on clean clothes or pajamas. Contact a health care provider if: Your skin gets irritated after scrubbing. You have questions about using your solution or cloth. You swallow any chlorhexidine. Call your local poison control center (1-540 028 8618 in the [place] Get help right away if: Your eyes itch badly, or they become very red or swollen. Your skin itches badly and is red or swollen. Your hearing changes. You have trouble seeing. You have swelling or tingling in your mouth or throat. You have trouble breathing. These symptoms may represent a serious problem that is an emergency. Do not wait to see if the symptoms will go away. Get medical help right away. Call your local emergency services (911 in the [place] Do not drive yourself to the hospital. Summary Chlorhexidine gluconate (CHG) is a germ-killing (antiseptic) solution that is used to clean the skin. Cleaning your skin with CHG may help to lower your risk for infection. You may be given CHG to use for bathing. It may be in a bottle or in a prepackaged cloth to use on your skin. Carefully follow your health care provider's instructions and the instructions on the product label. Do not use CHG if you have a chlorhexidine allergy. Contact your health care provider if your skin gets irritated after scrubbing. This information is not intended to replace advice given to you by your health care provider. Make sure you discuss any questions you have with your health care provider. Document Revised: 04/22/2021 Document Reviewed: 03/05/2020 Elsevier Patient Education  [place].

## 2022-02-03 ENCOUNTER — Encounter (HOSPITAL_COMMUNITY): Payer: Self-pay

## 2022-02-03 ENCOUNTER — Encounter (HOSPITAL_COMMUNITY)
Admission: RE | Admit: 2022-02-03 | Discharge: 2022-02-03 | Disposition: A | Discharge status: Home / Self Care | Payer: Medicare PPO | Source: Ambulatory Visit | Attending: General Surgery | Admitting: General Surgery

## 2022-02-03 VITALS — BP 143/95 | HR 74 | Temp 98.6°F | Resp 18 | Ht 63.0 in | Wt 133.0 lb

## 2022-02-03 DIAGNOSIS — T502X5A Adverse effect of carbonic-anhydrase inhibitors, benzothiadiazides and other diuretics, initial encounter: Secondary | ICD-10-CM | POA: Diagnosis not present

## 2022-02-03 DIAGNOSIS — Z01818 Encounter for other preprocedural examination: Secondary | ICD-10-CM | POA: Diagnosis not present

## 2022-02-03 HISTORY — DX: Unspecified osteoarthritis, unspecified site: M19.90

## 2022-02-03 LAB — BASIC METABOLIC PANEL
Anion gap: 9 (ref 5–15)
BUN: 16 mg/dL (ref 8–23)
CO2: 26 mmol/L (ref 22–32)
Calcium: 8.8 mg/dL — ABNORMAL LOW (ref 8.9–10.3)
Chloride: 106 mmol/L (ref 98–111)
Creatinine, Ser: 0.85 mg/dL (ref 0.44–1.00)
GFR, Estimated: >60 mL/min (ref >60)
Glucose, Bld: 86 mg/dL (ref 70–99)
Potassium: 3.9 mmol/L (ref 3.5–5.1)
Sodium: 141 mmol/L (ref 135–145)

## 2022-02-04 ENCOUNTER — Other Ambulatory Visit (HOSPITAL_COMMUNITY): Payer: Self-pay | Admitting: General Surgery

## 2022-02-04 ENCOUNTER — Ambulatory Visit (HOSPITAL_COMMUNITY)
Admission: RE | Admit: 2022-02-04 | Discharge: 2022-02-04 | Disposition: A | Discharge status: Home / Self Care | Payer: Medicare PPO | Source: Ambulatory Visit | Attending: General Surgery | Admitting: General Surgery

## 2022-02-04 DIAGNOSIS — R928 Other abnormal and inconclusive findings on diagnostic imaging of breast: Secondary | ICD-10-CM

## 2022-02-04 DIAGNOSIS — C50411 Malignant neoplasm of upper-outer quadrant of right female breast: Secondary | ICD-10-CM | POA: Diagnosis not present

## 2022-02-04 MED ORDER — LIDOCAINE HCL (PF) 2 % IJ SOLN
INTRAMUSCULAR | Status: AC
  Filled 2022-02-04: qty 10

## 2022-02-05 ENCOUNTER — Other Ambulatory Visit: Payer: Self-pay

## 2022-02-05 ENCOUNTER — Encounter (HOSPITAL_COMMUNITY)
Admission: RE | Disposition: A | Discharge status: Home / Self Care | Payer: Self-pay | Source: Ambulatory Visit | Attending: General Surgery

## 2022-02-05 ENCOUNTER — Other Ambulatory Visit (HOSPITAL_COMMUNITY): Payer: Self-pay | Admitting: General Surgery

## 2022-02-05 ENCOUNTER — Ambulatory Visit (HOSPITAL_COMMUNITY)
Admission: RE | Admit: 2022-02-05 | Discharge: 2022-02-05 | Disposition: A | Discharge status: Home / Self Care | Payer: Medicare PPO | Source: Ambulatory Visit | Attending: General Surgery | Admitting: General Surgery

## 2022-02-05 ENCOUNTER — Ambulatory Visit (HOSPITAL_COMMUNITY): Payer: Medicare PPO | Admitting: Anesthesiology

## 2022-02-05 ENCOUNTER — Ambulatory Visit (HOSPITAL_BASED_OUTPATIENT_CLINIC_OR_DEPARTMENT_OTHER): Payer: Medicare PPO | Admitting: Anesthesiology

## 2022-02-05 ENCOUNTER — Encounter (HOSPITAL_COMMUNITY): Payer: Self-pay | Admitting: General Surgery

## 2022-02-05 DIAGNOSIS — M1990 Unspecified osteoarthritis, unspecified site: Secondary | ICD-10-CM | POA: Insufficient documentation

## 2022-02-05 DIAGNOSIS — K219 Gastro-esophageal reflux disease without esophagitis: Secondary | ICD-10-CM | POA: Diagnosis not present

## 2022-02-05 DIAGNOSIS — Z171 Estrogen receptor negative status [ER-]: Secondary | ICD-10-CM | POA: Insufficient documentation

## 2022-02-05 DIAGNOSIS — Z79899 Other long term (current) drug therapy: Secondary | ICD-10-CM | POA: Diagnosis not present

## 2022-02-05 DIAGNOSIS — C50911 Malignant neoplasm of unspecified site of right female breast: Secondary | ICD-10-CM

## 2022-02-05 DIAGNOSIS — C50411 Malignant neoplasm of upper-outer quadrant of right female breast: Secondary | ICD-10-CM | POA: Diagnosis not present

## 2022-02-05 DIAGNOSIS — I10 Essential (primary) hypertension: Secondary | ICD-10-CM | POA: Insufficient documentation

## 2022-02-05 DIAGNOSIS — Z803 Family history of malignant neoplasm of breast: Secondary | ICD-10-CM | POA: Diagnosis not present

## 2022-02-05 DIAGNOSIS — R928 Other abnormal and inconclusive findings on diagnostic imaging of breast: Secondary | ICD-10-CM | POA: Diagnosis not present

## 2022-02-05 HISTORY — PX: PARTIAL MASTECTOMY WITH AXILLARY SENTINEL LYMPH NODE BIOPSY: SHX6004

## 2022-02-05 HISTORY — PX: BREAST LUMPECTOMY: SHX2

## 2022-02-05 SURGERY — PARTIAL MASTECTOMY WITH RADIO FREQUENCY LOCALIZER
Anesthesia: General | Site: Breast | Laterality: Right

## 2022-02-05 MED ORDER — LACTATED RINGERS IV SOLN
INTRAVENOUS | Status: DC
  Administered 2022-02-05: 1000 mL via INTRAVENOUS

## 2022-02-05 MED ORDER — BUPIVACAINE HCL (PF) 0.5 % IJ SOLN
INTRAMUSCULAR | Status: AC
  Filled 2022-02-05: qty 30

## 2022-02-05 MED ORDER — CHLORHEXIDINE GLUCONATE CLOTH 2 % EX PADS: 6.0000 | MEDICATED_PAD | Freq: Once | CUTANEOUS | Status: DC

## 2022-02-05 MED ORDER — PENTAFLUOROPROP-TETRAFLUOROETH EX AERO
INHALATION_SPRAY | CUTANEOUS | Status: AC
  Filled 2022-02-05: qty 30

## 2022-02-05 MED ORDER — MAGTRACE LYMPHATIC TRACER
INTRAMUSCULAR | Status: DC | PRN
  Administered 2022-02-05: 2 mL via INTRAMUSCULAR

## 2022-02-05 MED ORDER — EPHEDRINE SULFATE (PRESSORS) 50 MG/ML IJ SOLN
INTRAMUSCULAR | Status: DC | PRN
  Administered 2022-02-05: 5 mg via INTRAVENOUS
  Administered 2022-02-05: 10 mg via INTRAVENOUS
  Administered 2022-02-05: 5 mg via INTRAVENOUS

## 2022-02-05 MED ORDER — ORAL CARE MOUTH RINSE: 15.0000 mL | Freq: Once | OROMUCOSAL | Status: AC

## 2022-02-05 MED ORDER — EPHEDRINE 5 MG/ML INJ
INTRAVENOUS | Status: AC
  Filled 2022-02-05: qty 10

## 2022-02-05 MED ORDER — CHLORHEXIDINE GLUCONATE 0.12 % MT SOLN
15.0000 mL | Freq: Once | OROMUCOSAL | Status: AC
  Administered 2022-02-05: 15 mL via OROMUCOSAL

## 2022-02-05 MED ORDER — CEFAZOLIN SODIUM-DEXTROSE 2-4 GM/100ML-% IV SOLN
2.0000 g | INTRAVENOUS | Status: AC
  Administered 2022-02-05: 2 g via INTRAVENOUS
  Filled 2022-02-05: qty 100

## 2022-02-05 MED ORDER — DEXAMETHASONE SODIUM PHOSPHATE 10 MG/ML IJ SOLN
INTRAMUSCULAR | Status: DC | PRN
  Administered 2022-02-05: 10 mg via INTRAVENOUS

## 2022-02-05 MED ORDER — 0.9 % SODIUM CHLORIDE (POUR BTL) OPTIME
TOPICAL | Status: DC | PRN
  Administered 2022-02-05: 1000 mL

## 2022-02-05 MED ORDER — PROPOFOL 10 MG/ML IV BOLUS
INTRAVENOUS | Status: DC | PRN
  Administered 2022-02-05: 140 mg via INTRAVENOUS

## 2022-02-05 MED ORDER — ONDANSETRON HCL 4 MG/2ML IJ SOLN
INTRAMUSCULAR | Status: DC | PRN
  Administered 2022-02-05: 4 mg via INTRAVENOUS

## 2022-02-05 MED ORDER — DOXYCYCLINE HYCLATE 50 MG PO CAPS: 50.0000 mg | ORAL_CAPSULE | Freq: Two times a day (BID) | ORAL | 0 refills | Status: AC

## 2022-02-05 MED ORDER — TECHNETIUM TC 99M TILMANOCEPT KIT
1.0000 | PACK | Freq: Once | INTRAVENOUS | Status: AC | PRN
  Administered 2022-02-05: 1 via INTRADERMAL

## 2022-02-05 MED ORDER — OXYCODONE HCL 5 MG PO TABS: 5.0000 mg | ORAL_TABLET | ORAL | 0 refills | Status: DC | PRN

## 2022-02-05 MED ORDER — MIDAZOLAM HCL 2 MG/2ML IJ SOLN
INTRAMUSCULAR | Status: AC
  Filled 2022-02-05: qty 2

## 2022-02-05 MED ORDER — LIDOCAINE HCL (PF) 2 % IJ SOLN
INTRAMUSCULAR | Status: AC
  Filled 2022-02-05: qty 10

## 2022-02-05 MED ORDER — ONDANSETRON HCL 4 MG/2ML IJ SOLN
4.0000 mg | Freq: Once | INTRAMUSCULAR | Status: AC | PRN
  Administered 2022-02-05: 4 mg via INTRAVENOUS
  Filled 2022-02-05: qty 2

## 2022-02-05 MED ORDER — FENTANYL CITRATE (PF) 100 MCG/2ML IJ SOLN
INTRAMUSCULAR | Status: AC
  Filled 2022-02-05: qty 2

## 2022-02-05 MED ORDER — HYDROMORPHONE HCL 1 MG/ML IJ SOLN: 0.2500 mg | INTRAMUSCULAR | Status: DC | PRN

## 2022-02-05 MED ORDER — PROPOFOL 10 MG/ML IV BOLUS
INTRAVENOUS | Status: AC
  Filled 2022-02-05: qty 20

## 2022-02-05 MED ORDER — ONDANSETRON HCL 4 MG PO TABS: 4.0000 mg | ORAL_TABLET | Freq: Three times a day (TID) | ORAL | 1 refills | Status: DC | PRN

## 2022-02-05 MED ORDER — MIDAZOLAM HCL 5 MG/5ML IJ SOLN
INTRAMUSCULAR | Status: DC | PRN
  Administered 2022-02-05: 1 mg via INTRAVENOUS

## 2022-02-05 MED ORDER — FENTANYL CITRATE (PF) 100 MCG/2ML IJ SOLN
INTRAMUSCULAR | Status: DC | PRN
  Administered 2022-02-05: 50 ug via INTRAVENOUS
  Administered 2022-02-05 (×3): 25 ug via INTRAVENOUS
  Administered 2022-02-05: 50 ug via INTRAVENOUS
  Administered 2022-02-05: 25 ug via INTRAVENOUS

## 2022-02-05 MED ORDER — LIDOCAINE 2% (20 MG/ML) 5 ML SYRINGE
INTRAMUSCULAR | Status: DC | PRN
  Administered 2022-02-05: 60 mg via INTRAVENOUS

## 2022-02-05 MED ORDER — BUPIVACAINE HCL (PF) 0.5 % IJ SOLN
INTRAMUSCULAR | Status: DC | PRN
  Administered 2022-02-05: 20 mL

## 2022-02-05 SURGICAL SUPPLY — 35 items
ADH SKN CLS APL DERMABOND .7 (GAUZE/BANDAGES/DRESSINGS) ×1
APPLIER CLIP 9.375 SM OPEN (CLIP) ×2
CHLORAPREP W/TINT 26 (MISCELLANEOUS) ×1 IMPLANT
CLIP APPLIE 9.375 SM OPEN (CLIP) IMPLANT
CLOTH BEACON ORANGE TIMEOUT ST (SAFETY) ×1 IMPLANT
COVER LIGHT HANDLE STERIS (MISCELLANEOUS) ×2 IMPLANT
COVER PROBE FLEXI-FEEL 6X36 (MISCELLANEOUS) IMPLANT
COVER PROBE W GEL 5X96 (DRAPES) IMPLANT
DECANTER SPIKE VIAL GLASS SM (MISCELLANEOUS) ×1 IMPLANT
DERMABOND ADVANCED .7 DNX12 (GAUZE/BANDAGES/DRESSINGS) ×1 IMPLANT
DEVICE DUBIN W/COMP PLATE 8390 (MISCELLANEOUS) ×1 IMPLANT
ELECT REM PT RETURN 9FT ADLT (ELECTROSURGICAL) ×1
ELECTRODE REM PT RTRN 9FT ADLT (ELECTROSURGICAL) ×1 IMPLANT
GLOVE BIO SURGEON STRL SZ 6.5 (GLOVE) ×1 IMPLANT
GLOVE BIOGEL PI IND STRL 6.5 (GLOVE) ×1 IMPLANT
GLOVE BIOGEL PI IND STRL 7.0 (GLOVE) ×2 IMPLANT
GOWN STRL REUS W/TWL LRG LVL3 (GOWN DISPOSABLE) ×2 IMPLANT
KIT MARKER MARGIN INK (KITS) ×1 IMPLANT
KIT TURNOVER KIT A (KITS) ×1 IMPLANT
MANIFOLD NEPTUNE II (INSTRUMENTS) ×1 IMPLANT
NDL HYPO 25X1 1.5 SAFETY (NEEDLE) ×1 IMPLANT
NEEDLE HYPO 25X1 1.5 SAFETY (NEEDLE) ×1 IMPLANT
NS IRRIG 1000ML POUR BTL (IV SOLUTION) ×1 IMPLANT
PACK MINOR (CUSTOM PROCEDURE TRAY) ×1 IMPLANT
PAD ARMBOARD 7.5X6 YLW CONV (MISCELLANEOUS) ×1 IMPLANT
PENCIL SMOKE EVACUATOR (MISCELLANEOUS) IMPLANT
SET BASIN LINEN APH (SET/KITS/TRAYS/PACK) ×1 IMPLANT
SET LOCALIZER 20 PROBE US (MISCELLANEOUS) ×1 IMPLANT
SPONGE T-LAP 18X18 ~~LOC~~+RFID (SPONGE) ×1 IMPLANT
SUT MNCRL AB 4-0 PS2 18 (SUTURE) ×1 IMPLANT
SUT SILK 0 FSL (SUTURE) ×1 IMPLANT
SUT VIC AB 3-0 SH 27 (SUTURE)
SUT VIC AB 3-0 SH 27X BRD (SUTURE) ×1 IMPLANT
SUT VIC AB 4-0 PS2 27 (SUTURE) ×1 IMPLANT
SYR CONTROL 10ML LL (SYRINGE) ×1 IMPLANT

## 2022-02-05 NOTE — Anesthesia Procedure Notes (Signed)
Procedure Name: LMA Insertion Date/Time: 02/05/2022 10:44 AM  Performed by: Hewitt Blade, CRNAPre-anesthesia Checklist: Patient identified, Emergency Drugs available, Suction available and Patient being monitored Patient Re-evaluated:Patient Re-evaluated prior to induction Oxygen Delivery Method: Circle system utilized Preoxygenation: Pre-oxygenation with 100% oxygen Induction Type: IV induction Ventilation: Mask ventilation without difficulty LMA: LMA inserted LMA Size: 4.0 Number of attempts: 1 Placement Confirmation: positive ETCO2 and breath sounds checked- equal and bilateral Tube secured with: Tape Dental Injury: Teeth and Oropharynx as per pre-operative assessment

## 2022-02-05 NOTE — Anesthesia Postprocedure Evaluation (Signed)
Anesthesia Post Note  Patient: Angel Barry  Procedure(s) Performed: PARTIAL MASTECTOMY WITH RADIO FREQUENCY LOCALIZER WITH SENTINEL NODE (Right: Breast)  Patient location during evaluation: Phase II Anesthesia Type: General Level of consciousness: awake and alert and oriented Pain management: pain level controlled Vital Signs Assessment: post-procedure vital signs reviewed and stable Respiratory status: spontaneous breathing, nonlabored ventilation and respiratory function stable Cardiovascular status: blood pressure returned to baseline and stable Postop Assessment: no apparent nausea or vomiting Anesthetic complications: no  No notable events documented.   Last Vitals:  Vitals:   02/05/22 1415 02/05/22 1441  BP: 128/80 126/62  Pulse: 69 76  Resp: 15 16  Temp:  36.5 C  SpO2: 98% 100%    Last Pain:  Vitals:   02/05/22 1441  TempSrc: Oral  PainSc: 3                  Ethelda Deangelo C Loucinda Croy

## 2022-02-05 NOTE — Progress Notes (Signed)
[facility]  Updated family. Will see in 2 weeks. Will call with pathology results. Will send in Rx to [facility].  Curlene Labrum, MD [facility] [address] [place], [place] [address] [phone] (office)

## 2022-02-05 NOTE — Discharge Instructions (Signed)
Discharge instructions after breast surgery:   At the end of the case, there was noted to be blood on one of the probes. A small pinpoint hole was noted in the plastic covering. Out of an abundance of precaution, I am prescribing an antibiotic for 5 days to prevent any infection. It is unlikely that an infection would occur from a small pinpoint hole, but it is better to be safe.   We used a brown dye and not a blue dye for your sentinel node biopsy. You may have read about having green or blue urine. You will not experience this phenomenon.   A seroma may form in the breast incision or the axillary (armpit) incision. This will go away with time, but if you are have significant discomfort let me know.   Common Complaints: Pain and bruising at the incision sites.  Swelling at the incision sites. Stiffness of the arm.   Diet/ Activity: Diet as tolerated.  You may shower but do not take hot showers as this can disrupt the glue. Rest and listen to your body, but do not remain in bed all day.  Walk everyday for at least 15-20 minutes. Deep cough and move around every 1-2 hours in the first few days after surgery.  Do not lift > 10 lbs for the first 2 weeks after surgery. Do not do anything that makes you feel like you are putting unnecessary pull or stretch on the incision sites.  Do move your arm and shoulder (see exercises options below). If you do not move then you can get stiff and hurt more.  Do not pick at the dermabond glue on your incision sites.  This glue film will remain in place for 1-2 weeks and will start to peel off.  Do not place lotions or balms on your incision unless instructed to specifically by Dr. Constance Haw.   Pain Expectations and Narcotics: -After surgery you will have pain associated with your incisions and this is normal. The pain is muscular and nerve pain, and will get better with time. -You are encouraged and expected to take non narcotic medications like tylenol and  ibuprofen (when able) to treat pain as multiple modalities can aid with pain treatment. -Narcotics are only used when pain is severe or there is breakthrough pain. -You are not expected to have a pain score of 0 after surgery, as we cannot prevent pain. A pain score of 3-4 that allows you to be functional, move, walk, and tolerate some activity is the goal. The pain will continue to improve over the days after surgery and is dependent on your surgery. -Due to [place] law, we are only able to give a certain amount of pain medication to treat post operative pain, and we only give additional narcotics on a patient by patient basis.  -For most laparoscopic surgery, studies have shown that the majority of patients only need 10-15 narcotic pills, and for open surgeries most patients only need 15-20.   -Having appropriate expectations of pain and knowledge of pain management with non narcotics is important as we do not want anyone to become addicted to narcotic pain medication.  -Using ice packs in the first 48 hours and heating pads after 48 hours, wearing an abdominal binder (when recommended), and using over the counter medications are all ways to help with pain management.   -Simple acts like meditation and mindfulness practices after surgery can also help with pain control and research has proven the benefit of these  practices.  Medication: Take tylenol and ibuprofen as needed for pain control, alternating every 4-6 hours.  Example:  Tylenol '1000mg'$  @ 6am, 12noon, 6pm, 9mdnight (Do not exceed '4000mg'$  of tylenol a day). Ibuprofen '800mg'$  @ 9am, 3pm, 9pm, 3am (Do not exceed '3600mg'$  of ibuprofen a day).  Take Roxicodone for breakthrough pain every 4 hours.  Take Colace for constipation related to narcotic pain medication. If you do not have a bowel movement in 2 days, take Miralax over the counter.  Drink plenty of water to also prevent constipation.   Contact Information: If you have questions or concerns,  please call our office, [phone] Monday- Thursday 8AM-5PM and Friday 8AM-12Noon.  If it is after hours or on the weekend, please call [facility], [phone] and ask to speak to the surgeon on call for Dr[facility]   Exercises After Breast Surgery Do at least a few of the exercises below twice a day. It is ok to start the day after surgery and gradually build up the amount and type of exercises you do. Link to the exercises with pictures ([email or web address].   Deep Breathing Exercise Deep breathing can help you relax and ease discomfort and tightness around your incision (surgical cut). It's also a very good way to relieve stress during the day.  Sit comfortably in a chair. Take a slow, deep breath through your nose. Let your chest and belly expand. Breathe out slowly through your mouth. Repeat as many times as needed.  Arm and Shoulder Exercises Doing arm and shoulder exercises will help you get back your full range of motion on your affected side (the side where you had your surgery). With full range of motion, you'll be able to: Move your arm over your head and out to the side Move your arm behind your neck Move your arm to the middle of your back Do each of the exercises below 5 times a day. Keep doing this until you have a full range of motion again and can use your arm as you did before surgery in all your normal activities. This includes activities at work, at home, and in recreation or sports. If you had limited movement in your arm before surgery, your goal will be to get back as much movement as you had before.  If you get your full range of motion back quickly, keep doing these exercises once a day instead of 5 times a day. This is especially true if you feel any tightness in your chest, shoulder, or under your affected arm. These exercises can help keep scar tissue from  forming in your armpit and shoulder. Scar tissue can limit your arm movements later.  If you still have trouble moving your shoulder 4 weeks after your surgery, tell your surgeon. They'll tell you if you need more rehabilitation, such as physical or occupational therapy.  If you had one of the following surgeries, you can do the following set of exercises on the first day after your surgery, as long as your surgeon tells you it's safe.  Shoulder rolls The shoulder roll is a good exercise to start with because it gently stretches your chest and shoulder muscles.  Stand or sit comfortably with your arms relaxed at your sides. Start with backward shoulder rolls. In a circular motion, bring your shoulders forward, up, backward, and down. Do this 10 times. Switch directions and do 10 forward shoulder rolls. Bring your shoulders backward, up, forward, and down. Do this 10  times. Try to make the circles as big as you can and move both shoulders at the same time. If you have some tightness across your incision or chest, start with smaller circles and make them bigger as the tightness decreases. The backward direction might feel a little tighter across your chest than the forward direction. This will get better with practice.  Shoulder wings The shoulder wings exercise will help you get back outward movement of your shoulder. You can do this exercise while sitting or standing.  Place your hands on your chest or collarbone. Raise your elbows out to the side, limiting your range of motion as instructed by your healthcare team. Slowly lower your elbows. Do this 10 times. Then, slowly lower your hands. If you feel discomfort while doing this exercise, hold your position and do the deep breathing exercise. If the discomfort passes, raise your elbows a little higher. If it doesn't pass, don't raise your elbows any higher. Finish the exercise raising your elbows only high enough to feel a gentle stretch and no  discomfort.  Arm circles If you had surgery on both breasts, do this exercise with both arms, 1 arm at a time. Don't do this exercise with both arms at the same time. This will put too much pressure on your chest.  Stand with your feet slightly apart for balance. Raise your affected arm out to the side as high as you can, limiting your range of movement as instructed by your healthcare team. Start making slow, backward circles in the air with your arm. Make sure you're moving your arm from your shoulder, not your elbow. Keep your elbow straight. Increase the size of the circles until they're as big as you can comfortably make them, limiting your range of motion as instructed by your healthcare team. If you feel any aching or if your arm is tired, take a break. Keep doing the exercise when you feel better. Do 10 full backward circles. Then, slowly lower your arm to your side. Rest your arm for a moment. Follow steps 1 to 4 again, but this time make slow, forward circles.  W exercise You can do the W exercise while sitting or standing.  Form a "W" with your arms out to the side and palms facing forward (see Figure 4). Try to bring your hands up so they're even with your face. If you can't raise your arms that high, bring them to the highest comfortable position. Make sure to limit your range of motion as instructed by your healthcare team. Pinch your shoulder blades together and downward, as if you're squeezing a pencil between them. If you feel discomfort, stop at that position and do the deep breathing exercise. If the discomfort passes, try to bring your arms back a little further. If it doesn't pass, don't reach any further. Hold the furthest position that doesn't cause discomfort. Squeeze your shoulder blades together and downward for 5 seconds. Slowly bring your arms back down to the starting position. Repeat this movement 10 times.  Back Climb You can do the back climb stretch while  sitting or standing. You'll need a timer or stopwatch.  Place your hands behind your back. Hold the hand on your affected side with your other hand. If you had surgery on both breasts, use the arm that moves most easily to hold the other. Slowly slide your hands up the center of your back as far as you can. If you feel tightness near your incision, stop  at that position and do the deep breathing exercise. If the tightness decreases, try to slide your hands up a little further. If it doesn't decrease, don't slide your hands up any further. Hold the highest position you can for 1 minute. Use your stopwatch or timer to keep track. You should feel a gentle stretch in your shoulder area. After 1 minute, slowly lower your hands.  Hands behind neck You can do the hands behind neck stretch while sitting or standing. You'll need a timer or stopwatch.  Clasp your hands together on your lap or in front of you. Slowly raise your hands toward your head, keeping your elbows together in front of you, not out to the sides. Keep your head level. Don't bend your neck or head forward. Slide your hands over your head until you reach the back of your neck. When you get to this point, spread your elbows out to the sides. Hold this position for 1 minute. Use your stopwatch or timer to keep track. Breathe normally. Don't hold your breath as you stretch your body. If you have some tightness across your incision or chest, hold your position and do the deep breathing exercise. If the tightness decreases, continue with the movement. If the tightness stays the same, reach up and stretch your elbows back as best as you can without causing discomfort. Hold the position you're most comfortable in for 1 minute. Slowly come out of the stretch by bringing your elbows together and sliding your hands over your head. Then, slowly lower your arms.  Forward wall crawls You'll need 2 pieces of tape for the forward wall crawl  exercise.  Stand facing a wall. Your toes should be about 6 inches (15 centimeters) from the wall. Reach as high as you can with your unaffected arm. [place] that point with a piece of tape. This will be the goal for your affected arm. If you had surgery on both breasts, set your goal using the arm that moves most comfortably. Place both hands against the wall at a level that's comfortable. Crawl your fingers up the wall as far as you can, keeping them even with each other.. Try not to look up toward your hands or arch your back. When you get to the point where you feel a good stretch, but not pain, do the deep breathing exercise. Return to the starting position by crawling your fingers back down the wall. Repeat the wall crawl 10 times. Each time you raise your hands, try to crawl a little bit higher. On the 10th crawl, use the other piece of tape to mark the highest point you reached with your affected arm. This will let you to see your progress each time you do this exercise. As you become more flexible, you may need to take a step closer to the wall so you can reach a little higher.   Side wall crawls You'll also need 2 pieces of tape for the side wall crawl exercise.  You shouldn't feel pain while doing this exercise. It's normal to feel some tightness or pulling across the side of your chest. Focus on your breathing until the tightness decreases. Breathe normally throughout this exercise. Don't hold your breath.  Be careful not to turn your body toward the wall while doing this exercise. Make sure only the side of your body faces the wall.  If you had surgery on both breasts, start with step 3.  Stand with your unaffected side closest to the wall,  about 1 foot (30.5 centimeters) away from the wall. Reach as high as you can with your unaffected arm. [place] that point with a piece of tape (see Figure 8). This will be the goal for your affected arm. Turn your body so your affected side is now  closest to the wall. If you had surgery on both breasts, start with either side closest to the wall. Crawl your fingers up the wall as far as you can. When you get to the point where you feel a good stretch, but not pain, do the deep breathing exercise. Return to the starting position by crawling your fingers back down the wall. Repeat this exercise 10 times. On your 10th crawl, use a piece of tape to mark the highest point you reached with your affected arm. This will let you see your progress each time you do the exercise. If you had surgery on both breasts, repeat the exercise with your other arm.  Swelling After your surgery, you may have some swelling or puffiness in your hand or arm on your affected side. This is normal and usually goes away on its own.  If you notice swelling in your hand or arm, follow the tips below to help the swelling go away.  Raise your arm above your head and do hand pumps several times a day. To do hand pumps, slowly open and close your fist 10 times. This will help drain the fluid out of your arm. Don't hold your arm straight up over your head for more than a few minutes. This can cause your arm muscles to get tired. Raise your arm to the side a few times a day for about 20 minutes at a time. To do this, sit or lie down on your back. Rest your arm on a few pillows next to you so it's raised above the level of your heart. If you're able to sleep on your unaffected side, you can place 1 or 2 pillows in front of you and rest your affected arm on them while you sleep. If the swelling doesn't go down within 4 to 6 weeks, call your surgeon or nurse.

## 2022-02-05 NOTE — Interval H&P Note (Signed)
History and Physical Interval Note:  02/05/2022 9:36 AM  Angel Barry  has presented today for surgery, with the diagnosis of Carcinoma of upper-outer quadrant of right breast in female, estrogen receptor negative.  The various methods of treatment have been discussed with the patient and family. After consideration of risks, benefits and other options for treatment, the patient has consented to  Procedure(s): PARTIAL MASTECTOMY WITH RADIO FREQUENCY LOCALIZER WITH SENTINEL NODE (Right) as a surgical intervention.  The patient's history has been reviewed, patient examined, no change in status, stable for surgery.  I have reviewed the patient's chart and labs.  Questions were answered to the patient's satisfaction.    Marked, discussed again with Dr. 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  Angel Barry

## 2022-02-05 NOTE — Progress Notes (Signed)
Error in time. Given at 1351.

## 2022-02-05 NOTE — Progress Notes (Signed)
[facility]  Updated patient and family about pinpoint hole found in probe cover. Discussed antibiotics for five days following surgery given this potential break in sterile technique.  Curlene Labrum, MD

## 2022-02-05 NOTE — Transfer of Care (Signed)
Immediate Anesthesia Transfer of Care Note  Patient: Angel Barry  Procedure(s) Performed: PARTIAL MASTECTOMY WITH RADIO FREQUENCY LOCALIZER WITH SENTINEL NODE (Right: Breast)  Patient Location: PACU  Anesthesia Type:General  Level of Consciousness: awake  Airway & Oxygen Therapy: Patient Spontanous Breathing  Post-op Assessment: Report given to RN  Post vital signs: Reviewed and stable  Last Vitals:  Vitals Value Taken Time  BP 117/64 02/05/22 1320  Temp 36.9 C 02/05/22 1319  Pulse 79 02/05/22 1325  Resp 17 02/05/22 1325  SpO2 92 % 02/05/22 1325  Vitals shown include unvalidated device data.  Last Pain:  Vitals:   02/05/22 0928  TempSrc: Oral  PainSc: 0-No pain      Patients Stated Pain Goal: 5 (17/20/91 0681)  Complications: No notable events documented.

## 2022-02-05 NOTE — Anesthesia Preprocedure Evaluation (Signed)
Anesthesia Evaluation  Patient identified by MRN, date of birth, ID band Patient awake    Reviewed: Allergy & Precautions, H&P , NPO status , Patient's Chart, lab work & pertinent test results  Airway Mallampati: II  TM Distance: >3 FB Neck ROM: Full    Dental  (+) Dental Advisory Given, Teeth Intact   Pulmonary neg pulmonary ROS   Pulmonary exam normal breath sounds clear to auscultation       Cardiovascular Exercise Tolerance: Good hypertension, Pt. on medications Normal cardiovascular exam Rhythm:Regular Rate:Normal     Neuro/Psych negative neurological ROS  negative psych ROS   GI/Hepatic Neg liver ROS,GERD  Medicated and Controlled,,  Endo/Other  negative endocrine ROS    Renal/GU negative Renal ROS  negative genitourinary   Musculoskeletal  (+) Arthritis , Osteoarthritis,    Abdominal   Peds negative pediatric ROS (+)  Hematology negative hematology ROS (+)   Anesthesia Other Findings   Reproductive/Obstetrics negative OB ROS                             Anesthesia Physical Anesthesia Plan  ASA: 2  Anesthesia Plan: General   Post-op Pain Management: Dilaudid IV   Induction: Intravenous  PONV Risk Score and Plan: 4 or greater and Ondansetron and Dexamethasone  Airway Management Planned: LMA  Additional Equipment:   Intra-op Plan:   Post-operative Plan: Extubation in OR  Informed Consent: I have reviewed the patients History and Physical, chart, labs and discussed the procedure including the risks, benefits and alternatives for the proposed anesthesia with the patient or authorized representative who has indicated his/her understanding and acceptance.     Dental advisory given  Plan Discussed with: CRNA and Surgeon  Anesthesia Plan Comments:        Anesthesia Quick Evaluation

## 2022-02-05 NOTE — Op Note (Signed)
[facility] Operative Note  02/05/22  Preoperative Diagnosis: Right breast invasive cancer, triple negative   Postoperative Diagnosis: Same   Procedure(s) Performed: Partial mastectomy right breast after radiofrequency tag, and sentinel lymph node biopsy after radiotracer and magtrace    Surgeon: Lanell Matar. Constance Haw, MD   Assistants: No qualified resident was available    Anesthesia: General endotracheal   Anesthesiologist: Denese Killings, MD    Specimens: Right breast cancer painted; sentinel lymph node (1 hot, brown node and 2 hot nodes, less brown)    Estimated Blood Loss: Minimal   Blood Replacement: None    Complications: None   Wound Class: Clean; break in sterile technique with drape on probe with small pinpoint hole   Operative Indications: Angel Barry is a 77 yo with a right breast cancer that is 1m in size but triple negative. We discussed partial mastectomy after radiofrequency tag and sentinel lymph node. We discussed the risk of bleeding, infection, finding a positive margin or positive lymph nodes, needing more surgery or other treatment.   Findings: Biopsy clip and radiofrequency seed noted on the mammogram of the specimen    Procedure: In the preoperative area, radiotracer was placed around the areola. The patient was taken to the operating room and placed supine. General endotracheal anesthesia was induced. Intravenous antibiotics were  administered per protocol. Magtrace was placed in the subareolar complex and massaged for 5 minutes.  The right breast and axilla were prepared and draped in the usual sterile fashion.   Using the Hologic localizer, the right upper quadrant breast cancer, and an incision was made. Flaps were created. Using the localizer, I excised the breast tissue around the tag and removed the cancer and a 1cm margin around the seed. The breast tissue as painted.  The cavity was made hemostatic. The breast tissue was sent  for mammogram and both the clip and radiofrequency tracer were in the specimen.   Then attention was then turned to the axilla. Using the Magtrace probe and the Neoprobe, I identified the axilla point where there was the highest counts.  An incision was made and carried down into the tissue. A large brown node that was hot with both the Magtrace and Neoprobe was noted.  The lymph nodes were excised with sharp and blunt dissection, clipping any vessels or obvious lymphatic channels near the hilum. Ex vivo counts were 1618 (Magtrace) and 8918 (Neoprobe).  I then identified a second and third node with the Magtrace. These were small and not brown.  They were in the small channel of nodes as the initial node.  These were both removed with and Ex vivo counts were 290 and 120. The Neoprobe counts were all below 100 for the nodes and background. Following the removal of these lymph nodes, I noted an area of higher counts with the Magtrace but after investigating, I found the lymphatic channel heading to location of my clips from my first node excision.   The cavity was made hemostatic. Both incisions were inspected.  Clips were placed in the breast mass cavity to mark the area for radiation. Marcaine as injected into both areas. The breast incision was closed with 3-0 Vicryl interrupted sutures to close the flaps, leaving the cavity open for seroma formation for cosmesis.   The axilla was closed deep with 3-0 Vicryl interrupted and superficial with 3-0 Vicryl interrupted. The skin of both incisions were closed with 4-0 Subcuticular Monocryl and dermabond.   Final inspection revealed acceptable hemostasis.  All counts were correct at the end of the case. The patient was awakened from anesthesia and extubated without complication.  The patient went to the PACU in stable condition. At the completion of the case when the probes were being un-draped blood was noted on the Magtrace probe. The probe cover was investigated  and a pinpoint hole (water dripped through) was noted. This is a break in sterile technique, and I will prescribe the patient a round of antibiotics for prophylaxis.    Curlene Labrum, MD [facility] [address] [place], [place] [phone] [phone] (office)

## 2022-02-07 LAB — SURGICAL PATHOLOGY

## 2022-02-07 NOTE — Progress Notes (Signed)
Updated patient. Margins all clear. 26m greatest dimension and 3 lymph nodes negative for any cancer.

## 2022-02-12 NOTE — Addendum Note (Signed)
Addendum  created 02/12/22 1126 by Ollen Bowl, CRNA   Intraprocedure Staff edited

## 2022-02-19 ENCOUNTER — Ambulatory Visit (INDEPENDENT_AMBULATORY_CARE_PROVIDER_SITE_OTHER): Payer: Medicare PPO | Admitting: General Surgery

## 2022-02-19 ENCOUNTER — Encounter: Payer: Self-pay | Admitting: General Surgery

## 2022-02-19 VITALS — BP 149/81 | HR 64 | Temp 97.8°F | Resp 14 | Ht 63.0 in | Wt 134.0 lb

## 2022-02-19 DIAGNOSIS — Z171 Estrogen receptor negative status [ER-]: Secondary | ICD-10-CM

## 2022-02-19 DIAGNOSIS — C50411 Malignant neoplasm of upper-outer quadrant of right female breast: Secondary | ICD-10-CM

## 2022-02-19 NOTE — Patient Instructions (Addendum)
Continue with your exercises.  See Dr. Delton Coombes. Call with any concerns. The bruising and seroma (fluid/swelling ) will continue to improve. The tingling / nerve pain will continue to improve.

## 2022-02-20 NOTE — Progress Notes (Signed)
[facility]  Healing well. Has some bruising on the breast incision, some back arm tingling at times with activity.   BP (!) 149/81   Pulse 64   Temp 97.8 F (36.6 C) (Oral)   Resp 14   Ht 5' 3"$  (1.6 m)   Wt 134 lb (60.8 kg)   SpO2 95%   BMI 23.74 kg/m  Incision of the axilla and breast c/d/I with some induration and bruising on the breast, no erythema or drainage  Patient s/p R breast partial mastectomy and axillary sentinel node. Doing well. Healing.  Continue with your exercises.  See Dr. Delton Coombes. Call with any concerns. The bruising and seroma (fluid/swelling ) will continue to improve. The tingling / nerve pain will continue to improve.  Future Appointments  Date Time Provider [place]  03/05/2022  1:30 PM Derek Jack, MD [place], MD [facility] [address] [place], [place] [address] [phone] (office)

## 2022-03-03 IMAGING — MG MM DIGITAL SCREENING BILAT W/ TOMO AND CAD
8 series · 8 of 24 positions shown · non-contrast
Comparison: Previous exam(s).

CLINICAL DATA: Screening.

EXAM:
DIGITAL SCREENING BILATERAL MAMMOGRAM WITH TOMOSYNTHESIS AND CAD
TECHNIQUE: Bilateral screening digital craniocaudal and mediolateral oblique
mammograms were obtained. Bilateral screening digital breast
tomosynthesis was performed. The images were evaluated with
computer-aided detection.

[R CC synth-2D]
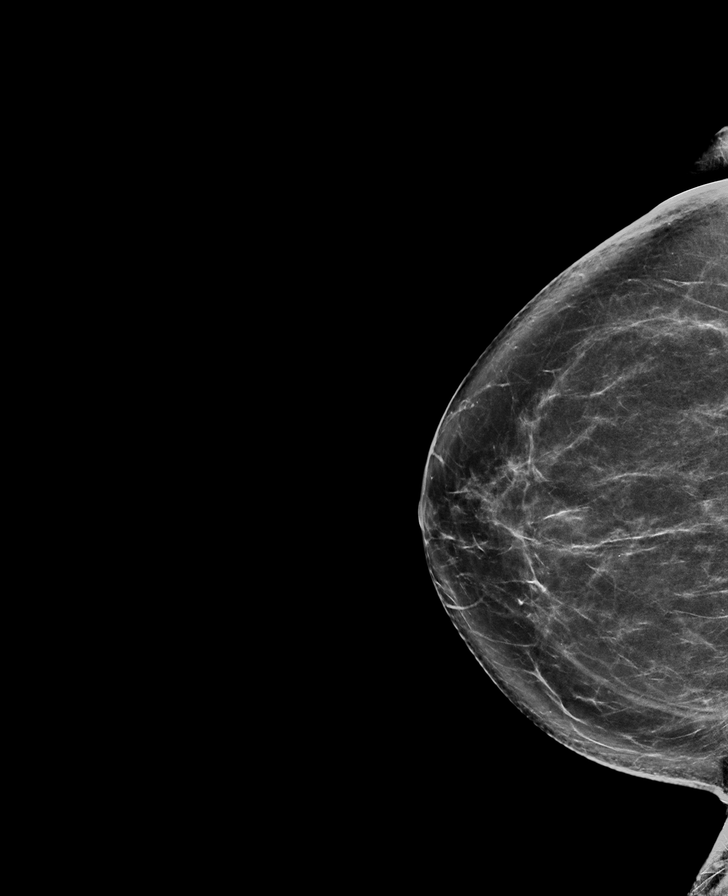

[L MLO synth-2D]
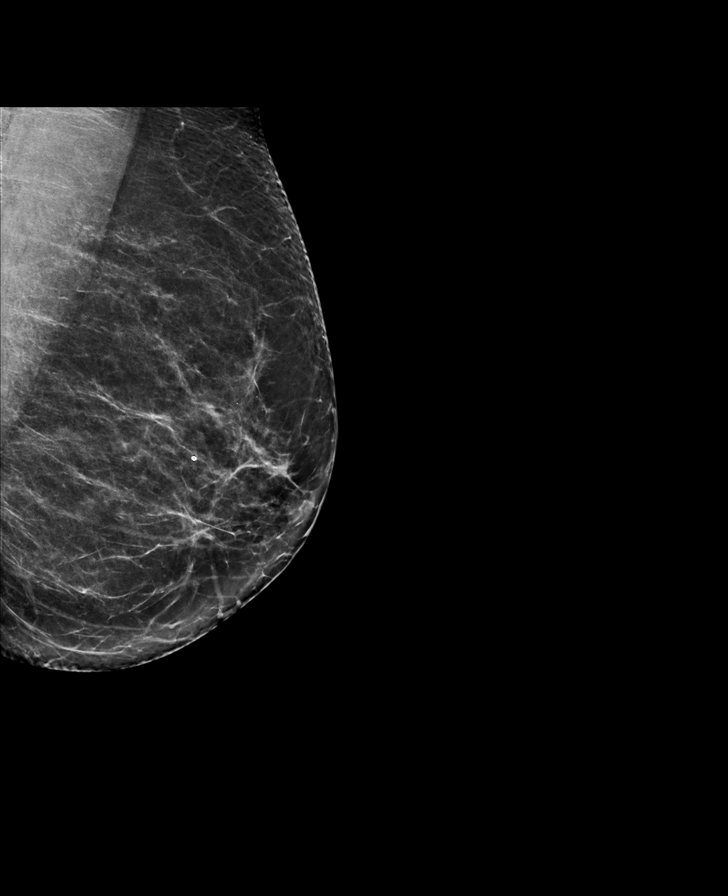

[R MLO synth-2D]
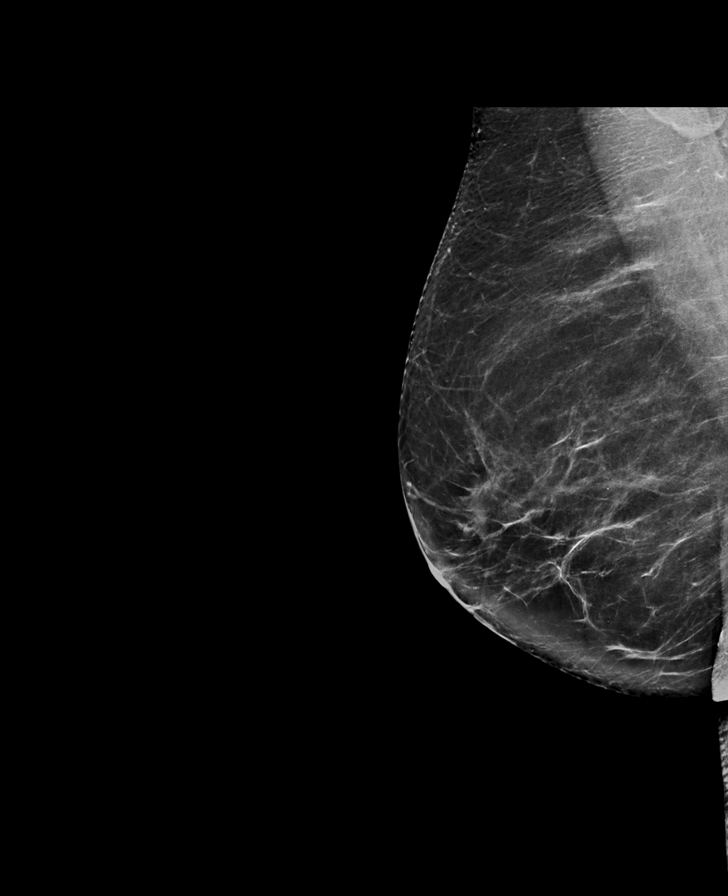

[L CC synth-2D]
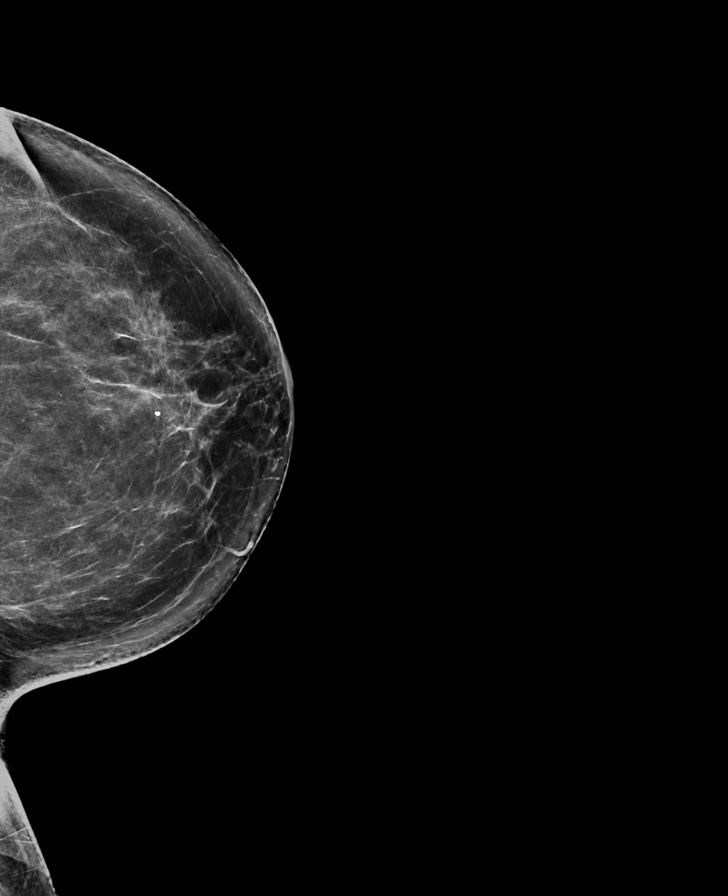

[R MLO tomo · tomo slice 41/81.0]
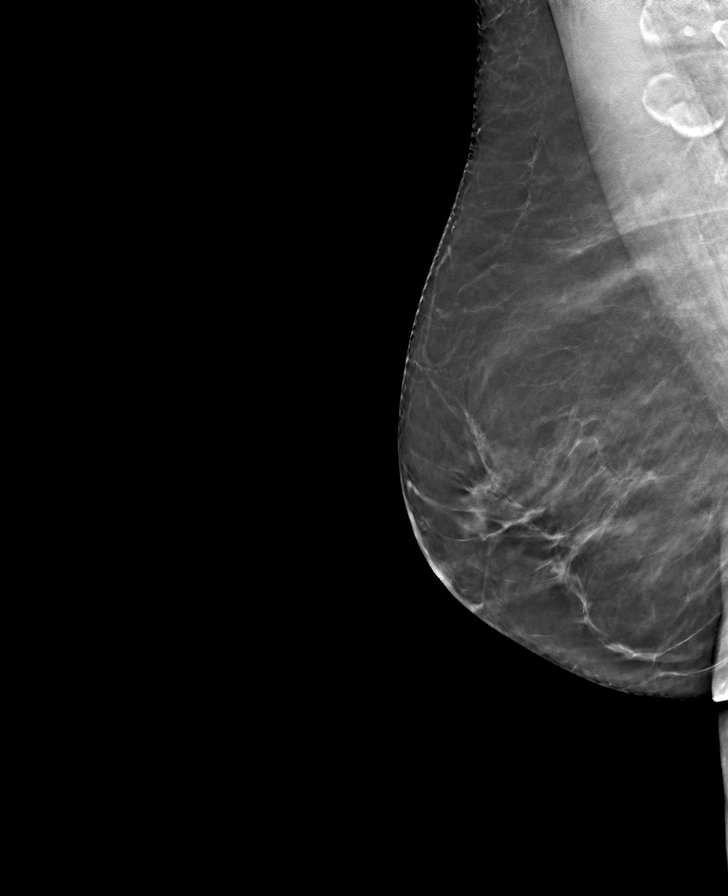

[L CC tomo · tomo slice 41/80.0]
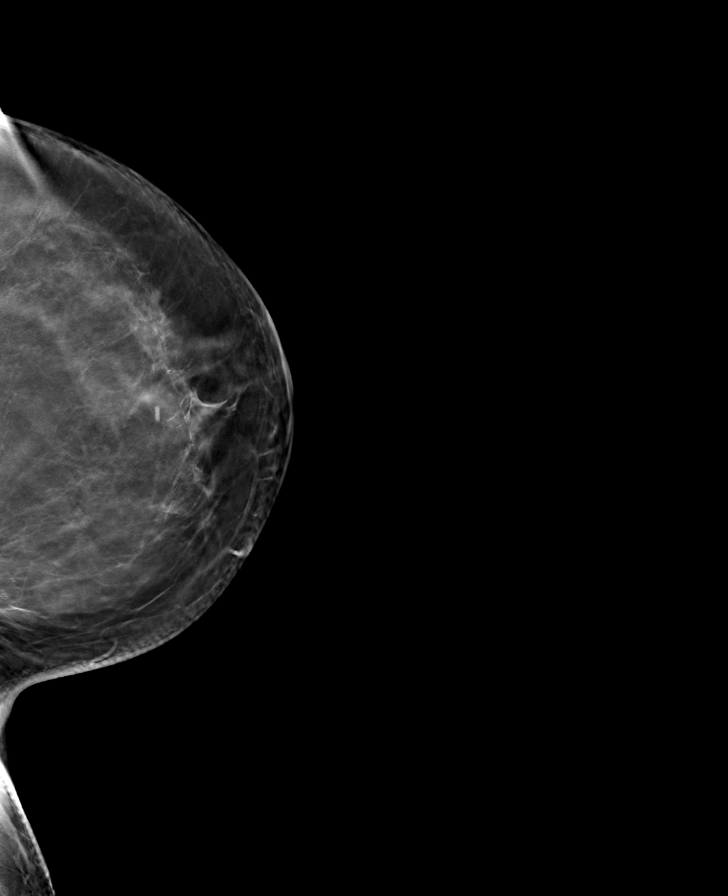

[R CC tomo · tomo slice 41/81.0]
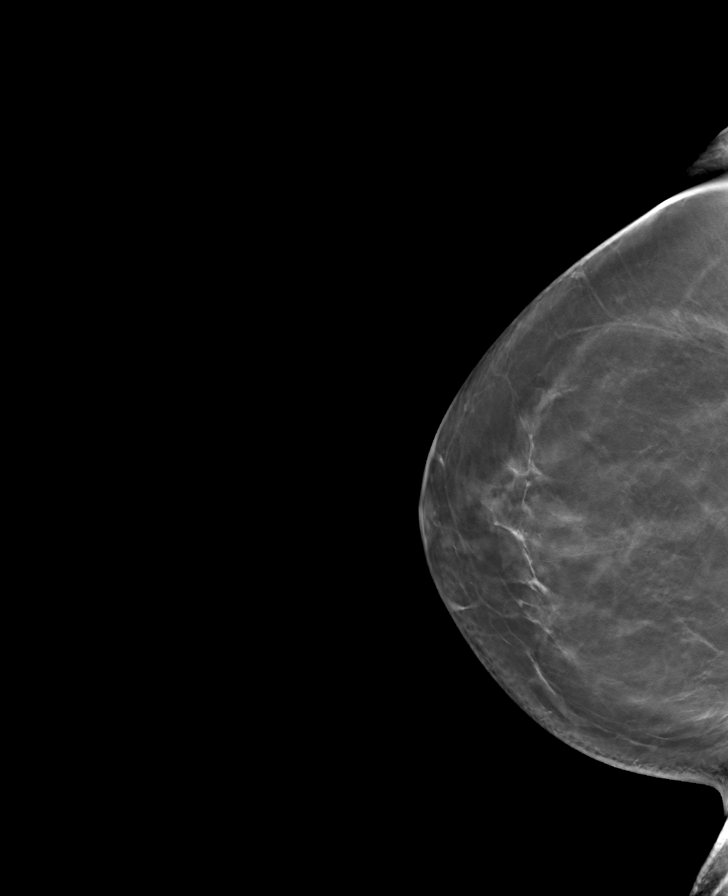

[L MLO tomo · tomo slice 37/74.0]
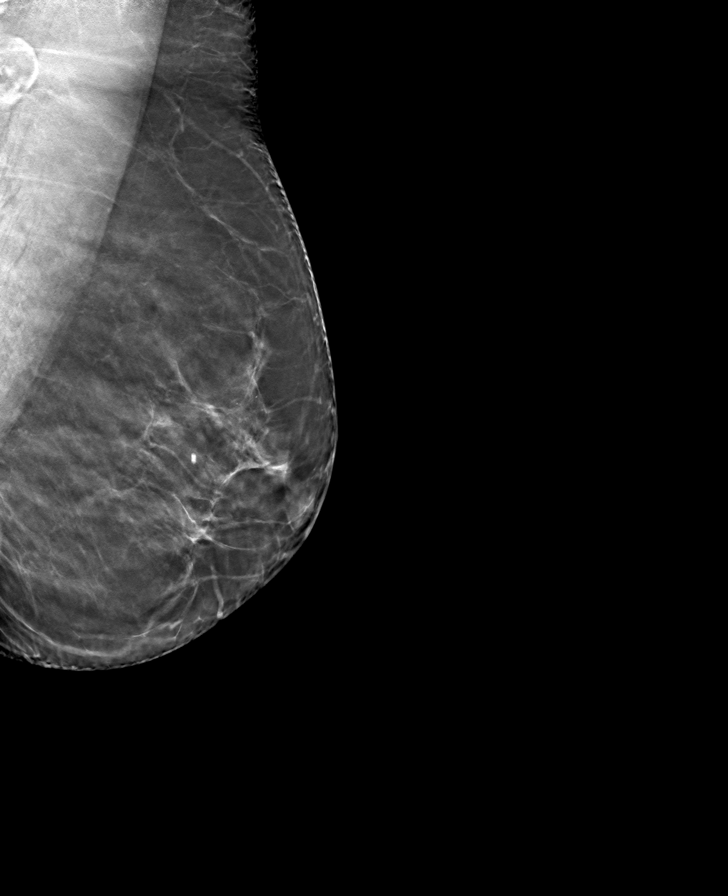

[8 of 24 positions shown; findings below may reference images not displayed]

ACR Breast Density Category b: There are scattered areas of
fibroglandular density.
FINDINGS: There are no findings suspicious for malignancy.
IMPRESSION: No mammographic evidence of malignancy. A result letter of this
screening mammogram will be mailed directly to the patient.

RECOMMENDATION:
Screening mammogram in one year. (Code:51-O-LD2)

BI-RADS CATEGORY  1: Negative.

## 2022-03-05 ENCOUNTER — Encounter: Payer: Self-pay | Admitting: Hematology

## 2022-03-05 ENCOUNTER — Other Ambulatory Visit: Payer: Self-pay

## 2022-03-05 ENCOUNTER — Inpatient Hospital Stay: Payer: Medicare PPO | Attending: Hematology | Admitting: Hematology

## 2022-03-05 VITALS — BP 132/81 | HR 72 | Temp 97.9°F | Resp 17 | Ht 63.0 in | Wt 134.2 lb

## 2022-03-05 DIAGNOSIS — I10 Essential (primary) hypertension: Secondary | ICD-10-CM

## 2022-03-05 DIAGNOSIS — Z171 Estrogen receptor negative status [ER-]: Secondary | ICD-10-CM | POA: Insufficient documentation

## 2022-03-05 DIAGNOSIS — C50411 Malignant neoplasm of upper-outer quadrant of right female breast: Secondary | ICD-10-CM

## 2022-03-05 NOTE — Progress Notes (Signed)
START ON PATHWAY REGIMEN - Breast     A cycle is every 21 days:     Docetaxel      Cyclophosphamide   **Always confirm dose/schedule in your pharmacy ordering system**  Patient Characteristics: Postoperative without Neoadjuvant Therapy (Pathologic Staging), Invasive Disease, Adjuvant Therapy, HER2 Negative, ER Negative, Node Negative, pT1a-c, N1mi or pT1c or Higher, pN0 Therapeutic Status: Postoperative without Neoadjuvant Therapy (Pathologic Staging) AJCC Grade: G3 AJCC N Category: pN0 AJCC M Category: cM0 ER Status: Negative (-) AJCC 8 Stage Grouping: IB HER2 Status: Negative (-) Oncotype Dx Recurrence Score: Not Appropriate AJCC T Category: pT1c PR Status: Negative (-) Intent of Therapy: Curative Intent, Discussed with Patient 

## 2022-03-05 NOTE — Progress Notes (Signed)
[facility] NOTE  Patient Care Team: [place] Nation, MD as PCP - General (Internal Medicine) Derek Jack, MD as Medical Oncologist (Medical Oncology) Brien Mates, RN as Oncology Nurse Navigator (Medical Oncology)  CHIEF COMPLAINTS/PURPOSE OF CONSULTATION:  Triple negative right breast cancer  HISTORY OF PRESENTING ILLNESS:  Angel Barry 77 y.o. female is seen in consultation today at the request of Dr. 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  She had a screening mammogram on 12/23/2021 which showed possible mass in the right breast.  This was followed with right breast diagnostic mammogram and ultrasound on 01/09/2022 which showed 8 x 6 x 7 mm irregular mass in the right breast at 11 o'clock position 7 cm from the nipple.  No enlarged abnormal lymph nodes in the right axilla.  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  She was then evaluated by Dr. Constance Haw and underwent right lumpectomy and SLNB on 02/05/2022.  Final pathology showed 1.3 cm invasive ductal carcinoma, grade 3, 0/3 involved lymph nodes and margins negative.  She lives at home with her husband and is independent of all ADLs and IADLs.  She is a retired Pharmacist, hospital.  She denies any prior MI or CVA.  No prior history of thrombosis.  No tingling or numbness in the extremities. She denies any prior breast biopsies.  She attained menarche at age 30.  Menopause at age 77.  She has 3 children.  Age at first childbirth was 46.  She took oral contraceptive pills for 2 years.  Did not take any hormone replacement therapy.  MEDICAL HISTORY:  Past Medical History:  Diagnosis Date   Arthritis    GERD (gastroesophageal reflux disease)    Hypercholesteremia    Hypertension     SURGICAL HISTORY: Past Surgical History:  Procedure Laterality Date    BREAST BIOPSY Right 01/14/2022   [place] RT BREAST BX W LOC DEV 1ST LESION IMG BX SPEC US GUIDE 01/14/2022 Everlean Alstrom, MD [facility]   CATARACT EXTRACTION W/PHACO Left 12/18/2016   Procedure: CATARACT EXTRACTION PHACO AND INTRAOCULAR LENS PLACEMENT ([place]);  Surgeon: Baruch Goldmann, MD;  Location: [facility];  Service: Ophthalmology;  Laterality: Left;  CDE: 9.55   CATARACT EXTRACTION W/PHACO Right 08/06/2018   Procedure: CATARACT EXTRACTION PHACO AND INTRAOCULAR LENS PLACEMENT (I[facility]);  Surgeon: Baruch Goldmann, MD;  Location: [facility];  Service: Ophthalmology;  Laterality: Right;  CDE: 13.46    SOCIAL HISTORY: Social History   Socioeconomic History   Marital status: Married    Spouse name: Not on file   Number of children: Not on file   Years of education: Not on file   Highest education level: Not on file  Occupational History   Not on file  Tobacco Use   Smoking status: Never   Smokeless tobacco: Never  Vaping Use   Vaping Use: Never used  Substance and Sexual Activity   Alcohol use: Yes    Comment: very rarely   Drug use: No   Sexual activity: Yes    Birth control/protection: Post-menopausal  Other Topics Concern   Not on file  Social History Narrative   Not on file   Social Determinants of Health   Financial Resource Strain: Not on file  Food Insecurity: No Food Insecurity (03/05/2022)   Hunger Vital Sign    Worried About Running Out of Food in the Last Year: Never  true    Ran Out of Food in the Last Year: Never true  Transportation Needs: No Transportation Needs (03/05/2022)   PRAPARE - Hydrologist (Medical): No    Lack of Transportation (Non-Medical): No  Physical Activity: Not on file  Stress: Not on file  Social Connections: Not on file  Intimate Partner Violence: Not At Risk (03/05/2022)   Humiliation, Afraid, Rape, and Kick questionnaire    Fear of Current or Ex-Partner: No    Emotionally Abused: No    Physically Abused: No    Sexually  Abused: No    FAMILY HISTORY: Family History  Problem Relation Age of Onset   Atrial fibrillation Mother    Leukemia Mother    Irregular heart beat Father    Brain cancer Son     ALLERGIES:  is allergic to macrobid [nitrofurantoin macrocrystal].  MEDICATIONS:  Current Outpatient Medications  Medication Sig Dispense Refill   acetaminophen (TYLENOL) 500 MG tablet Take 1,000 mg by mouth daily as needed for moderate pain.     ascorbic acid (VITAMIN C) 500 MG tablet Take 500 mg by mouth in the morning.     cetirizine (ZYRTEC) 10 MG tablet Take 10 mg by mouth daily as needed for allergies.     Cholecalciferol (VITAMIN D) 50 MCG (2000 UT) tablet Take 2,000 Units by mouth in the morning.     ELDERBERRY PO Take 10 mLs by mouth in the morning.     fluticasone (FLONASE) 50 MCG/ACT nasal spray Place 2 sprays into both nostrils in the morning.     fosinopril-hydrochlorothiazide (MONOPRIL-HCT) 20-12.5 MG tablet Take 1 tablet by mouth at bedtime.     pantoprazole (PROTONIX) 40 MG tablet Take 40 mg by mouth daily before breakfast.     polyethylene glycol (MIRALAX / GLYCOLAX) 17 g packet Take 17 g by mouth at bedtime.     Polyethylene Glycol 400 (BLINK TEARS OP) Place 2 drops into both eyes daily as needed (dry/irritated eyes.).     simvastatin (ZOCOR) 10 MG tablet Take 10 mg by mouth every evening.     Zinc 50 MG TABS Take 50 mg by mouth in the morning.     No current facility-administered medications for this visit.    REVIEW OF SYSTEMS:   Constitutional: Denies fevers, chills or abnormal night sweats Eyes: Denies blurriness of vision, double vision or watery eyes Ears, nose, mouth, throat, and face: Denies mucositis or sore throat Respiratory: Denies cough, dyspnea or wheezes Cardiovascular: Denies palpitation, chest discomfort or lower extremity swelling Gastrointestinal:  Denies nausea, heartburn or change in bowel habits Skin: Denies abnormal skin rashes Lymphatics: Denies new  lymphadenopathy or easy bruising Neurological:Denies numbness, tingling or new weaknesses.  Positive for occasional dizziness and headaches. Behavioral/Psych: Mood is stable, no new changes  All other systems were reviewed with the patient and are negative. Breast exam: Right breast upper outer quadrant lumpectomy scar is well-healed.  Axillary lymph node biopsy scar is also well-healed.  No palpable masses.  PHYSICAL EXAMINATION: ECOG PERFORMANCE STATUS: 1 - Symptomatic but completely ambulatory  Vitals:   03/05/22 1330  BP: 132/81  Pulse: 72  Resp: 17  Temp: 97.9 F (36.6 C)  SpO2: 100%   Filed Weights   03/05/22 1330  Weight: 134 lb 3.2 oz (60.9 kg)    GENERAL:alert, no distress and comfortable SKIN: skin color, texture, turgor are normal, no rashes or significant lesions EYES: normal, conjunctiva are pink and non-injected, sclera clear OROPHARYNX:no  exudate, no erythema and lips, buccal mucosa, and tongue normal  NECK: supple, thyroid normal size, non-tender, without nodularity LYMPH:  no palpable lymphadenopathy in the cervical, axillary or inguinal LUNGS: clear to auscultation and percussion with normal breathing effort HEART: regular rate & rhythm and no murmurs and no lower extremity edema ABDOMEN:abdomen soft, non-tender and normal bowel sounds Musculoskeletal:no cyanosis of digits and no clubbing  PSYCH: alert & oriented x 3 with fluent speech NEURO: no focal motor/sensory deficits  LABORATORY DATA:  I have reviewed the data as listed Lab Results  Component Value Date   WBC 5.9 12/11/2016   HGB 14.2 12/11/2016   HCT 44.0 12/11/2016   MCV 96.5 12/11/2016   PLT 137 (L) 12/11/2016     Chemistry      Component Value Date/Time   NA 141 02/03/2022 1400   K 3.9 02/03/2022 1400   CL 106 02/03/2022 1400   CO2 26 02/03/2022 1400   BUN 16 02/03/2022 1400   CREATININE 0.85 02/03/2022 1400      Component Value Date/Time   CALCIUM 8.8 (L) 02/03/2022 1400        RADIOGRAPHIC STUDIES: I have personally reviewed the radiological images as listed and agreed with the findings in the report. MM Breast Surgical Specimen  Result Date: 02/05/2022 CLINICAL DATA:  Status post radiofrequency localized right breast lumpectomy. EXAM: SPECIMEN RADIOGRAPH OF THE RIGHT BREAST COMPARISON:  Previous exam(s). FINDINGS: Status post excision of the right breast. The radiofrequency localizer and ribbon shaped clip are present within the specimen. IMPRESSION: Specimen radiograph of the right breast. Electronically Signed   By: Lajean Manes M.D.   On: 02/05/2022 12:08  NM Sentinel Node Inj-No Rpt (Breast)  Result Date: 02/05/2022 Sulfur Colloid was injected by the Nuclear Medicine Technologist for sentinel lymph node localization.   MM DIAG BREAST TOMO UNI RIGHT  Result Date: 02/04/2022 CLINICAL DATA:  Post RF tag placement at site of biopsy proven malignancy in the right breast at the 11 o'clock position. EXAM: DIAGNOSTIC RIGHT MAMMOGRAM POST ULTRASOUND-GUIDED RADIOFREQUENCY PLACEMENT TECHNIQUE: CC and MLO tomograms were performed of the right breast. COMPARISON:  Previous exam(s). FINDINGS: Mammographic images were obtained following ultrasound-guided radiofrequency placement. The radiofrequency tag is present adjacent to the ribbon shaped clip and biopsy proven malignancy in the right breast at the 11 o'clock position. IMPRESSION: Radiofrequency tag adjacent to ribbon shaped biopsy marking clip and biopsy proven malignancy in the right breast at the 11 o'clock position. Final Assessment: Post Procedure Mammograms for Seed Placement Electronically Signed   By: Everlean Alstrom M.D.   On: 02/04/2022 09:31  [place] RT RADIO FREQUENCY TAG LOC US GUIDE  Result Date: 02/04/2022 CLINICAL DATA:  77 year old female with recently diagnosed breast cancer presents for ultrasound-guided RF tag placement prior to planned lumpectomy. EXAM: ULTRASOUND GUIDED RADIOFREQUENCY TAG LOCALIZATION  OF THE RIGHT BREAST COMPARISON:  Previous exam(s). FINDINGS: Patient presents for radioactive seed localization prior to right breast lumpectomy. I met with the patient and we discussed the procedure of seed localization including benefits and alternatives. We discussed the high likelihood of a successful procedure. We discussed the risks of the procedure including infection, bleeding, tissue injury and further surgery. We discussed the low dose of radioactivity involved in the procedure. Informed, written consent was given. The usual time-out protocol was performed immediately prior to the procedure. Using ultrasound guidance, sterile technique, 1% lidocaine and an RF tag, the calcifications with associated biopsy marking clip in the right breast at 11 o'clock was localized  using a lateral to medial approach. The follow-up mammogram images confirm the seed in the expected location and were marked for Dr. Constance Haw. Follow-up survey of the patient confirms presence of the radioactive seed. The patient tolerated the procedure well and was released from the Breast Center. She was given instructions regarding seed removal. IMPRESSION: Radiofrequency tag localization right breast. No apparent complications. Electronically Signed   By: Everlean Alstrom M.D.   On: 02/04/2022 09:29   ASSESSMENT:  1.  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 - Right breast 11:00 mass biopsy on 01/14/2022 - Pathology (01/14/2022): Invasive poorly differentiated ductal adenocarcinoma, grade 3, ER negative, PR negative, Ki-67 98%, HER2 0+ - Right lumpectomy and SLNB on 02/05/2022 - Pathology: 1.3 cm invasive ductal carcinoma, grade 3, 0/3 lymph nodes involved, margins negative, pT1 cpN0  2.  Social/family history: - Lives at home with her husband.  Retired Pharmacist, hospital.  Non-smoker. - Mother died of  AML at age 38.  Maternal aunt had AML.  Maternal grandmother had leukemia, unknown type.  Maternal uncle had chronic leukemia.  Paternal aunt had bladder cancer.  Paternal first cousin had breast cancer.  Her son was diagnosed with GBM at age 70.  PLAN:  1.  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 - Due to high risk of recurrence with TNBC, I have recommended adjuvant chemotherapy. - Because of her age, I have recommended nonanthracycline-based regimen. - We talked about 4 cycles of TC every 21 days with G-CSF support. - We discussed the side effects in detail.  She is agreeable to proceed with treatment. - We will ask Dr. Constance Haw for port placement. - Will make a referral for genetics evaluation.   Orders Placed This Encounter  Procedures   CBC with Differential    Standing Status:   Standing    Number of Occurrences:   10    Standing Expiration Date:   03/06/2023   Comprehensive metabolic panel    Standing Status:   Standing    Number of Occurrences:   10    Standing Expiration Date:   03/06/2023   Magnesium    Standing Status:   Standing    Number of Occurrences:   10    Standing Expiration Date:   03/06/2023   Ambulatory referral to Genetics    Referral Priority:   Routine    Referral Type:   Consultation    Referral Reason:   Specialty Services Required    Number of Visits Requested:   1    All questions were answered. The patient knows to call the clinic with any problems, questions or concerns.      Derek Jack, MD 03/05/2022 2:07 PM

## 2022-03-05 NOTE — Patient Instructions (Addendum)
[facility]  Discharge Instructions  You were seen and examined today by Dr. Delton Coombes. Dr. Delton Coombes is a medical oncologist, meaning that he specializes in the treatment of cancer diagnoses. Dr. Delton Coombes discussed your past medical history, family history of cancers, and the events that led to you being here today.  You were referred to Dr. Delton Coombes for ongoing management of Triple Negative Breast Cancer. This is a typically aggressive type of breast cancer.  To prevent the cancer from recurring, Dr. Delton Coombes has discussed proceeding with 4 cycles of chemotherapy. You will receive two chemotherapy drugs including Taxotere and Carboplatin. This is given once every 3 weeks for 4 total doses.  The purpose of chemotherapy is to prevent your cancer from returning. It has been removed during surgery at this point.  The most common side effects of this chemotherapy is hair loss, fatigue, and decreased blood counts. There is a risk of neuropathy, which is more common in patients with diabetes, but please alert [place] immediately should you experience neuropathy.  We will ask Dr. Constance Haw to place a Port-A-Cath so that we can safely administer chemotherapy.  Prior to the start of chemotherapy, you will meet with our chemotherapy educator to discuss the medications in detail.  You will be referred to Genetic Counseling.  Following completion of chemotherapy, you will be referred to a Radiation Oncologist to discuss the role for radiation to protect the impacted breast from cancer returning.  Follow-up as scheduled.  Thank you for choosing [facility] to provide your oncology and hematology care.   To afford each patient quality time with our provider, please arrive at least 15 minutes before your scheduled appointment time. You may need to reschedule your appointment if you arrive late (10 or more minutes). Arriving late affects you and other  patients whose appointments are after yours.  Also, if you miss three or more appointments without notifying the office, you may be dismissed from the clinic at the provider's discretion.    Again, thank you for choosing [facility].  Our hope is that these requests will decrease the amount of time that you wait before being seen by our physicians.   If you have a lab appointment with the [facility] please come in thru the Main Entrance and check in at the main information desk.           _____________________________________________________________  Should you have questions after your visit to [facility], please contact our office at [phone] and follow the prompts.  Our office hours are 8:00 a.m. to 4:30 p.m. Monday - Thursday and 8:00 a.m. to 2:30 p.m. Friday.  Please note that voicemails left after 4:00 p.m. may not be returned until the following business day.  We are closed weekends and all major holidays.  You do have access to a nurse 24-7, just call the main number to the clinic [phone] and do not press any options, hold on the line and a nurse will answer the phone.    For prescription refill requests, have your pharmacy contact our office and allow 72 hours.    Masks are optional in the cancer centers. If you would like for your care team to wear a mask while they are taking care of you, please let them know. You may have one support person who is at least 78 years old accompany you for your appointments.

## 2022-03-06 ENCOUNTER — Inpatient Hospital Stay: Payer: Medicare PPO

## 2022-03-06 ENCOUNTER — Other Ambulatory Visit: Payer: Self-pay

## 2022-03-06 DIAGNOSIS — Z171 Estrogen receptor negative status [ER-]: Secondary | ICD-10-CM

## 2022-03-06 DIAGNOSIS — C50411 Malignant neoplasm of upper-outer quadrant of right female breast: Secondary | ICD-10-CM | POA: Diagnosis not present

## 2022-03-06 LAB — CBC WITH DIFFERENTIAL/PLATELET
Abs Immature Granulocytes: 0.01 10*3/uL (ref 0.00–0.07)
Basophils Absolute: 0.1 10*3/uL (ref 0.0–0.1)
Basophils Relative: 1 %
Eosinophils Absolute: 0.1 10*3/uL (ref 0.0–0.5)
Eosinophils Relative: 2 %
HCT: 41.7 % (ref 36.0–46.0)
Hemoglobin: 14.4 g/dL (ref 12.0–15.0)
Immature Granulocytes: 0 %
Lymphocytes Relative: 35 %
Lymphs Abs: 2.4 10*3/uL (ref 0.7–4.0)
MCH: 32 pg (ref 26.0–34.0)
MCHC: 34.5 g/dL (ref 30.0–36.0)
MCV: 92.7 fL (ref 80.0–100.0)
Monocytes Absolute: 0.6 10*3/uL (ref 0.1–1.0)
Monocytes Relative: 10 %
Neutro Abs: 3.5 10*3/uL (ref 1.7–7.7)
Neutrophils Relative %: 52 %
Platelets: 158 10*3/uL (ref 150–400)
RBC: 4.5 MIL/uL (ref 3.87–5.11)
RDW: 12.2 % (ref 11.5–15.5)
WBC: 6.7 10*3/uL (ref 4.0–10.5)
nRBC: 0 % (ref 0.0–0.2)

## 2022-03-06 LAB — COMPREHENSIVE METABOLIC PANEL
ALT: 16 U/L (ref 0–44)
AST: 20 U/L (ref 15–41)
Albumin: 4.1 g/dL (ref 3.5–5.0)
Alkaline Phosphatase: 49 U/L (ref 38–126)
Anion gap: 9 (ref 5–15)
BUN: 15 mg/dL (ref 8–23)
CO2: 25 mmol/L (ref 22–32)
Calcium: 9.1 mg/dL (ref 8.9–10.3)
Chloride: 104 mmol/L (ref 98–111)
Creatinine, Ser: 0.86 mg/dL (ref 0.44–1.00)
GFR, Estimated: >60 mL/min (ref >60)
Glucose, Bld: 109 mg/dL — ABNORMAL HIGH (ref 70–99)
Potassium: 3.9 mmol/L (ref 3.5–5.1)
Sodium: 138 mmol/L (ref 135–145)
Total Bilirubin: 0.4 mg/dL (ref 0.3–1.2)
Total Protein: 7.1 g/dL (ref 6.5–8.1)

## 2022-03-06 NOTE — Patient Instructions (Addendum)
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 You will be treated in the clinic every 3 weeks with a combination of chemotherapy drugs. They are docetaxel (Taxotere) and cyclophosphamide (Cytoxan). You will also receive a white blood cell booster shot about 48 hours after each treatment. You will come for treatment every 3 weeks for a total of four times. The intent of treatment is cure/to prevent the recurrence of this cancer. You will see the doctor regularly throughout treatment.  We will obtain blood work from you prior to every treatment and monitor your results to make sure it is safe to give your treatment. The doctor monitors your response to treatment by the way you are feeling, your blood work, and by obtaining scans periodically. There will be wait times while you are here for treatment. It will take about 30 minutes to 1 hour for your lab work to result. Then there will be wait times while pharmacy mixes your medications.    Medications you will receive in the clinic prior to your chemotherapy medications:  Aloxi:  ALOXI is used in adults to help prevent nausea and vomiting that happens with certain chemotherapy drugs.  Aloxi is a long acting medication, and will remain in your system for about two days.   Dexamethasone:  This is a steroid given prior to chemotherapy to help prevent allergic reactions; it may also help prevent and control nausea and diarrhea.    Neulasta (or similar) - this medication is not chemotherapy but is being given because you have had chemo. It is usually given 24-48 hours after the completion of chemotherapy. This medication works by boosting your bone marrow's supply of white blood cells. White blood cells are what protect our bodies against infection. The medication is given in the form of a subcutaneous injection. It is given in the fatty tissue of your abdomen, or in the skin on  the back of your arm . It is a short needle. The major side effect of this medication is bone or muscle pain. The drug of choice to relieve or lessen the pain is Aleve or Ibuprofen. If a physician has ever told you not to take Aleve or Ibuprofen - then don't take it. You should then take Tylenol/acetaminophen. Take either medication as the bottle directs you to. The level of pain you experience as a result of this injection can range from none, to mild or moderate, or severe. Please let us know if you develop moderate or severe bone pain. You can take Claritin 10 mg over the counter for a few days prior to and after receiving Neulasta to help with the bone aches and pains.       Cyclophosphamide   About This Drug   Cyclophosphamide is used to treat cancer. It is given orally (by mouth) and in the vein (IV). It takes 30 minutes to infuse.   Possible Side Effects   Decrease in the number of white blood cells. This may raise your risk of infection.    Fever and neutropenic fever. A type of fever that can develop when you have a very low number of white blood cells which can be life-threatening.    Nausea and vomiting (throwing up)    Diarrhea (loose bowel movements)    Hair loss. Hair loss is often temporary, although with certain medicine, hair loss can sometimes be permanent. Hair loss may happen suddenly or gradually.  If you lose hair, you may lose it from your head, face, armpits, pubic area, chest, and/or legs. You may also notice your hair getting thin.   Note: Not all possible side effects are included above.   Warnings and Precautions    Severe bone marrow suppression, which can be life-threatening. This is a decrease in the number of white blood cells, red blood cells, and platelets. This may raise your risk of infection, make you tired and weak, and raise your risk of bleeding.    Abnormal heartbeat and/or heart changes such as inflammation (swelling) in the tissue of the heart  and/or congestive heart failure - your heart is not pumping blood as well as it should be, and fluid can build up in your body.    Effects on the bladder and kidneys that may be life-threatening. This drug may cause inflammation (swelling), irritation and bleeding in the bladder and/or kidneys. You may have blood in your urine.  Changes in your liver function and blockage of small veins in the liver, which can cause liver failure and be life-threatening.    Inflammation (swelling) and/or thickening of the lungs, and changes to the small vessels of your lungs. You may have a dry cough or trouble breathing.    This drug may cause slow wound healing.    A severe decreased level of sodium in your blood, which can be life-threatening.    This drug may raise your risk of getting a second cancer.     These side effects may be more severe if you are receiving high doses of this medication included in pre-transplant chemotherapy.   Note: Some of the side effects above are very rare. If you have concerns and/or questions, please discuss them with your medical team.   Important Information    Your doctor may recommend that you drink extra fluids during or after your treatment to flush your bladder and urinate often to help decrease the risk of the effects on your bladder.    Cyclophosphamide may cause slow wound healing. If you must have emergency surgery or have an accident that results in a wound, tell the doctor that you are on cyclophosphamide.    This drug may impair your ability to drive or use machinery. Use caution and talk your doctor and/or nurse about any precautions you may need to take.    This drug may be present in the saliva, tears, sweat, urine, stool, vomit, semen, and vaginal secretions. Talk to your doctor and/or your nurse about the necessary precautions to take during this time.   Treating Side Effects    Manage tiredness by pacing your activities for the day.    Be sure to  include periods of rest between energy-draining activities.    To decrease the risk of infection, wash your hands regularly.    Avoid close contact with people who have a cold, the flu, or other infections.    Take your temperature as your doctor or nurse tells you, and whenever you feel like you may have a fever.    To help decrease the risk of bleeding, use a soft toothbrush. Check with your nurse before using dental floss.  Be very careful when using knives or tools.    Use an electric shaver instead of a razor.    Drink plenty of fluids (a minimum of eight glasses per day is recommended).    If you throw up or have diarrhea, you should drink more fluids so that  you do not become dehydrated (lack of water in the body from losing too much fluid).    If you have diarrhea, eat low-fiber foods that are high in protein and calories and avoid foods that can irritate your digestive tracts or lead to cramping.    Ask your nurse or doctor about medicine that can lessen or stop your diarrhea.    To help with nausea and vomiting, eat small, frequent meals instead of three large meals a day. Choose foods and drinks that are at room temperature. Ask your nurse or doctor about other helpful tips and medicine that is available to help stop or lessen these symptoms.    To help with hair loss, wash with a mild shampoo and avoid washing your hair every day. Avoid coloring your hair.    Avoid rubbing your scalp, pat your hair or scalp dry. Limit your use of hair spray, electric curlers, blow dryers, and curling irons.    If you are interested in getting a wig, talk to your nurse and they can help you get in touch with programs in your local area.   Food and Drug Interactions    There are no known interactions of cyclophosphamide with food.    Check with your doctor or pharmacist about all other prescription medicines and over-the-counter medicines and dietary supplements (vitamins, minerals, herbs,  and others) you are taking before starting this medicine as there are known drug interactions with cyclophosphamide. Also, check with your doctor or pharmacist before starting any new prescription or over-the-counter medicines, or dietary supplement to make sure that there are no interactions.    There are known interactions of cyclophosphamide with blood thinning medicine such as warfarin. Ask your doctor what precautions you should take.    When given IV, this drug may contain alcohol and may affect your central nervous system, which is made up of your brain and spinal cord. You may feel dizzy and very sleepy.   When to Call the Doctor   Call your doctor or nurse if you have any of these symptoms and/or any new or unusual symptoms:    Fever of 100.4 F (38 C) or higher    Chills    Tiredness that interferes with your daily activities    Feeling dizzy or lightheaded    Easy bleeding or bruising    Dry cough    Wheezing and/or trouble breathing   Swelling of the hands, feet, or any other part of the body    Weight gain of 5 pounds in one week (fluid retention)  Feeling that your heart is beating in a fast or not normal way (palpitations)    Pain in your chest    Nausea that stops you from eating or drinking and/or is not relieved by prescribed medicines    Throwing up more than 3 times a day    Diarrhea, 4 times in one day or diarrhea with lack of strength or a feeling of being dizzy    Decreased urine, very dark urine, or difficulty urinating    Pain when passing urine or blood in urine    Signs of possible liver problems: dark urine, pale bowel movements, pain in your abdomen, feeling very tired and weak, unusual itching, or yellowing of the eyes or skin    Signs of possible severe low sodium levels: confusion, agitation, feeling that your heart is beating fast, passing out, seizure and/or coma    If you think you may be pregnant  or may have impregnated your partner    Reproduction Warnings    Pregnancy warning: This drug can have harmful effects on the unborn baby. Women of childbearing potential should use highly effective methods of birth control during your cancer treatment and for up to 1 year after stopping treatment. Men with female partners who are pregnant or may become pregnant should use a condom during your cancer treatment and for 4 months after stopping treatment. Let your doctor know right away if you think you may be pregnant or may have impregnated your partner.    In women, menstrual bleeding may become irregular or stop while you are getting this drug. Do not assume that you cannot become pregnant if you do not have a menstrual period.    Breastfeeding warning: Women should not breastfeed during treatment and for 1 week after stopping treatment because this drug could enter the breast milk and cause harm to a breastfeeding baby.    Fertility warning: In men and women both, this drug may affect your ability to have children in the future. Talk with your doctor or nurse if you plan to have children. Ask for information on sperm or egg banking.   Docetaxel (Taxotere)   About This Drug   Docetaxel is used to treat cancer. It is given in the vein (IV). It will take 1 hour to infuse.  The first infusion will take longer (about 1.5 hours) due to the fact that we start this drug at a very slow rate and gradually increase the rate of infusion until the maximum rate is reached. This is done to monitor for infusion reactions, and to make sure you will tolerated this drug without adverse effects. If you tolerate the first infusion, all subsequent infusions will be started at the normal rate and given over 1 hour.  Possible Side Effects    Bone marrow suppression. This is a decrease in the number of white blood cells, red blood cells, and platelets. This may raise your risk of infection, make you tired and weak, and raise your risk of bleeding.     Neutropenic fever. A type of fever that can develop when you have a very low number of white blood cells which can be life-threatening.    Soreness of the mouth and throat. You may have red areas, white patches, or sores that hurt.    Nausea and vomiting (throwing up)    Constipation (not able to move bowels)    Diarrhea (loose bowel movements)    Infections    Fluid retention - swelling of the hands, feet, or any other part of the body. Fluid may build-up around your lungs and/or heart.    Changes in the way food and drinks taste    Effects on the nerves are called peripheral neuropathy. You may feel numbness, tingling, or pain in your hands and feet. It may be hard for you to button your clothes, open jars, or walk as usual. The effect on the nerves may get worse with more doses of the drug. These effects get better in some people after the drug is stopped but it does not get better in all people.    Decreased appetite (decreased hunger)    Weakness    Pain    Muscle pain/aching    Trouble breathing    Changes in your nail color, you may have nail loss and/or brittle nail    Hair loss. Hair loss is often temporary, although there have been  cases of permanent hair loss reported. Hair loss may happen suddenly or gradually. If you lose hair, you may lose it from your head, face, armpits, pubic area, chest, and/or legs. You may also notice your hair getting thin.    Allergic skin reaction. You may develop blisters on your skin that are filled with fluid or a severe red rash all over your body that may be painful.    Allergic reactions, including anaphylaxis are rare but may happen in some patients. Signs of allergic reaction to this drug may be swelling of the face, feeling like your tongue or throat are swelling, trouble breathing, rash, itching, fever, chills, feeling dizzy, and/or feeling that your heart is beating in a fast or not normal way. If this happens, do not take another  dose of this drug. You should get urgent medical treatment.   Note: Not all possible side effects are included above.   Warnings and Precautions    Severe bone marrow suppression, including febrile neutropenia which can be life-threatening    Severe allergic reactions, including anaphylaxis which can be life-threatening  Colitis, which is swelling (inflammation) in the colon - symptoms are diarrhea (loose bowel movements), stomach cramping, and sometimes blood in the bowel movements  Severe skin reactions, including redness, swelling, or peeling of skin  Severe swelling in the eye or other changes in eyesight    Severe fluid retention    If you have a history of abnormal liver function, receive high doses of docetaxel, or have a history of lung cancer and have received treatment with a platinum (type of chemotherapy medication), you have an increased risk of death.    Severe weakness    This drug may raise your risk of getting a second cancer such as leukemia, lymphoma, myelodysplastic syndrome, and kidney cancer.    Severe peripheral neuropathy    This drug contains alcohol and may affect your central nervous system. The central nervous system is made up of your brain and spinal cord. You may feel dizzy and very sleepy.    Tumor lysis syndrome: This drug may act on the cancer cells very quickly. This may affect how your kidneys work. Note: Some of the side effects above are very rare. If you have concerns and/or questions, please discuss them with your medical team. Important Information    This drug may be present in the saliva, tears, sweat, urine, stool, vomit, semen, and vaginal secretions. Talk to your doctor and/or your nurse about the necessary precautions to take during this time.    This drug may impair your ability to drive or use machinery. Use caution and tell your nurse or doctor if you feel dizzy, very sleepy, and/or experience low blood pressure. Treating Side Effects     Manage tiredness by pacing your activities for the day.    Be sure to include periods of rest between energy-draining activities.    Get regular exercise. If you feel too tired to exercise vigorously, try taking a short walk.     To decrease the risk of infection, wash your hands regularly.    Avoid close contact with people who have a cold, the flu, or other infections.    Take your temperature as your doctor or nurse tells you, and whenever you feel like you may have a fever.    To help decrease the risk of bleeding, use a soft toothbrush. Check with your nurse before using dental floss.    Be very careful when using  knives or tools.    Use an electric shaver instead of a razor.    Mouth care is very important and will help food taste better and improve your appetite. Your mouth care should consist of routine, gentle cleaning of your teeth or dentures and rinsing your mouth with a mixture of 1/2 teaspoon of salt in 8 ounces of water or 1/2 teaspoon of baking soda in 8 ounces of water. This should be done at least after each meal and at bedtime.    If you have mouth sores, avoid mouthwash that has alcohol. Also avoid alcohol and smoking because they can bother your mouth and throat.    Taking good care of your mouth may help food taste better and improve your appetite    Drink plenty of fluids (a minimum of eight glasses per day is recommended).    If you throw up or have diarrhea, you should drink more fluids so that you do not become dehydrated (lack of water in the body from losing too much fluid).    If you have diarrhea, eat low-fiber foods that are high in protein and calories and avoid foods that can irritate your digestive tracts or lead to cramping.    If you are not able to move your bowels, check with your doctor or nurse before you use enemas, laxatives, or suppositories.    Ask your doctor or nurse about medicines that are available to help stop or lessen constipation  and/ or diarrhea.    To help with nausea and vomiting, eat small, frequent meals instead of three large meals a day. Choose foods and drinks that are at room temperature. Ask your nurse or doctor about other helpful tips and medicine that is available to help stop or lessen these symptoms.    To help with decreased appetite, eat foods high in calories and protein, such as meat, poultry, fish, dry beans, tofu, eggs, nuts, milk, yogurt, cheese, ice cream, pudding, and nutritional supplements.    Consider using sauces and spices to increase taste. Daily exercise, with your doctor's approval, may increase your appetite.    Keeping your pain under control is important to your well-being. Please tell your doctor or nurse if you are experiencing pain.    If you get a rash do not put anything on it unless your doctor or nurse says you may. Keep the area around the rash clean and dry. Ask your doctor for medicine if your rash bothers you.    Keeping your nails moisturized may help with brittleness.    To help with hair loss, wash with a mild shampoo and avoid washing your hair every day. Avoid coloring your hair.    Avoid rubbing your scalp, pat your hair or scalp dry.    Limit your use of hair spray, electric curlers, blow dryers, and curling irons.    If you are interested in getting a wig, talk to your nurse and they can help you get in touch with programs in your local area.    If you have numbness and tingling in your hands and feet, be careful when cooking, walking, and handling sharp objects and hot liquids.   Food and Drug Interactions    This drug may interact with grapefruit and grapefruit juice. Talk to your doctor as this could make side effects worse.    This drug may interact with other medicines. Tell your doctor and pharmacist about all the prescription and over-the-counter medicines and dietary supplements (  vitamins, minerals, herbs, and others) that you are taking at this time.  Also, check with your doctor or pharmacist before starting any new prescription or over-the-counter medicines, or dietary supplements to make sure that there are no interactions.    This drug may interact with St. John's Wort and may lower the levels of the drug in your body, which can make it less effective. When to Call the Doctor Call your doctor or nurse if you have any of these symptoms and/or any new or unusual symptoms:    Fever of 100.4 F (38 C) or higher    Chills    Blurred vision or other changes in eyesight, excessive tearing    Symptoms of being drunk, confusion, or being very sleepy    Feeling dizzy or lightheaded  Tiredness and/or weakness that interferes with your daily activities  Easy bruising or bleeding    Wheezing and/or trouble breathing    Chest pain    Pain in your mouth or throat that makes it hard to eat or drink    Nausea that stops you from eating or drinking and/or is not relieved by prescribed medicines    Throwing up more than 3 times a day    Lasting loss of appetite or rapid weight loss of five pounds in a week    Diarrhea, 4 times in one day or diarrhea with lack of strength or a feeling of being dizzy    No bowel movement in 3 days or when you feel uncomfortable    Pain in your abdomen that does not go away    Blood in your stool    Swelling of the hands, feet, or any other part of the body    Weight gain of 5 pounds in one week (fluid retention)    New rash and/or itching  Rash that is not relieved by prescribed medicines    Signs of inflammation/infection (redness, swelling, pain) of the tissue around your nails.    Signs of allergic reaction: swelling of the face, feeling like your tongue or throat are swelling, trouble breathing, rash, itching, fever, chills, feeling dizzy, and/or feeling that your heart is beating in a fast or not normal way. If this happens, call 911 for emergency care.    Flu-like symptoms: fever, headache,  muscle and joint aches, and fatigue (low energy, feeling weak)    Signs of possible liver problems: dark urine, pale bowel movements, pain in your abdomen, feeling very tired and weak, unusual itching, or yellowing of the eyes or skin    Numbness, tingling, or pain in your hands and feet    Signs of tumor lysis: confusion or agitation, decreased urine, nausea/vomiting, diarrhea, muscle cramping, numbness and/or tingling, seizures.    General pain that does not go away or is not relieved by prescribed medicine    If you think you may be pregnant or have impregnated your partner   Reproduction Warnings    Pregnancy warning: This drug can have harmful effects on the unborn baby. Women of childbearing potential should use effective methods of birth control during your cancer treatment and for 6 months after stopping treatment. Men with female partners of childbearing potential should use effective methods of birth control during your cancer treatment and for 3 months after stopping treatment. Let your doctor know right away if you think you may be pregnant or may have impregnated your partner.    Breastfeeding warning: Women should not breastfeed during treatment and for 1  week after stopping treatment because this drug could enter the breast milk and cause harm to a breastfeeding baby.    Fertility warning: In men, this drug may affect your ability to have children in the future. Talk with your doctor or nurse if you plan to have children. Ask for information on sperm banking.   SELF CARE ACTIVITIES WHILE ON CHEMOTHERAPY/IMMUNOTHERAPY:  Hydration Increase your fluid intake and drink at least 64 ounces (1 liter) of water/decaffeinated beverages per day after treatment. You can still have your cup of coffee or soda but these beverages do not count as part of your 64 ounces that you need to drink daily. Limit alcohol intake.  Medications Continue taking your normal prescription medication as  prescribed.  If you start any new herbal or new supplements please let us know first to make sure it is safe.  Mouth Care Have teeth cleaned professionally before starting treatment. Keep dentures and partial plates clean. Use soft toothbrush and do not use mouthwashes that contain alcohol. Biotene is a good mouthwash that is available at most pharmacies or may be ordered by calling [phone]. Use warm salt water gargles (1 teaspoon salt per 1 quart warm water) before and after meals and at bedtime. Or you may rinse with 2 tablespoons of three-percent hydrogen peroxide mixed in eight ounces of water. If you are still having problems with your mouth or sores in your mouth please call the clinic. If you need dental work, please let the doctor know before you go for your appointment so that we can coordinate the best possible time for you in regards to your chemo regimen. You need to also let your dentist know that you are actively taking chemo. We may need to do labs prior to your dental appointment.  Skin Care Always use sunscreen that has not expired and with SPF (Sun Protection Factor) of 50 or higher. Wear hats to protect your head from the sun. Remember to use sunscreen on your hands, ears, face, & feet.  Use good moisturizing lotions such as udder cream, eucerin, or even Vaseline. Some chemotherapies can cause dry skin, color changes in your skin and nails.    Avoid long, hot showers or baths. Use gentle, fragrance-free soaps and laundry detergent. Use moisturizers, preferably creams or ointments rather than lotions because the thicker consistency is better at preventing skin dehydration. Apply the cream or ointment within 15 minutes of showering. Reapply moisturizer at night, and moisturize your hands every time after you wash them.   Infection Prevention Please wash your hands for at least 30 seconds using warm soapy water. Handwashing is the #1 way to prevent the spread of germs. Stay away  from sick people or people who are getting over a cold. If you develop respiratory systems such as green/yellow mucus production or productive cough or persistent cough let us know and we will see if you need an antibiotic. It is a good idea to keep a pair of gloves on when going into grocery stores/[employer] to decrease your risk of coming into contact with germs on the carts, etc. Carry alcohol hand gel with you at all times and use it frequently if out in public. If your temperature reaches 100.5 or higher please call the clinic and let us know.  If it is after hours or on the weekend please go to the ER if your temperature is over 100.4.  Please have your own personal thermometer at home to use.  Sex and bodily fluids If you are going to have sex, a condom must be used to protect the person that isn't taking immunotherapy. For a few days after treatment, immunotherapy can be excreted through your bodily fluids.  When using the toilet please close the lid and flush the toilet twice.  Do this for a few day after you have had immunotherapy.   Contraception It is not known for sure whether or not immunotherapy drugs can be passed on through semen or secretions from the vagina. Because of this some doctors advise people to use a barrier method if you have sex during treatment. This applies to vaginal, anal or oral sex.  Generally, doctors advise a barrier method only for the time you are actually having the treatment and for about a week after your treatment.  Advice like this can be worrying, but this does not mean that you have to avoid being intimate with your partner. You can still have close contact with your partner and continue to enjoy sex.  Animals If you have cats or birds we just ask that you not change the litter or change the cage.  Please have someone else do this for you while you are on immunotherapy.   Food Safety During and After Cancer Treatment Food safety is important for people  both during and after cancer treatment. Cancer and cancer treatments, such as chemotherapy, radiation therapy, and stem cell/bone marrow transplantation, often weaken the immune system. This makes it harder for your body to protect itself from foodborne illness, also called food poisoning. Foodborne illness is caused by eating food that contains harmful bacteria, parasites, or viruses.  Foods to avoid Some foods have a higher risk of becoming tainted with bacteria. These include: Unwashed fresh fruit and vegetables, especially leafy vegetables that can hide dirt and other contaminants Raw sprouts, such as alfalfa sprouts Raw or undercooked beef, especially ground beef, or other raw or undercooked meat and poultry Fatty, fried, or spicy foods immediately before or after treatment.  These can sit heavy on your stomach and make you feel nauseous. Raw or undercooked shellfish, such as oysters. Sushi and sashimi, which often contain raw fish.  Unpasteurized beverages, such as unpasteurized fruit juices, raw milk, raw yogurt, or cider Undercooked eggs, such as soft boiled, over easy, and poached; raw, unpasteurized eggs; or foods made with raw egg, such as homemade raw cookie dough and homemade mayonnaise  Simple steps for food safety  Shop smart. Do not buy food stored or displayed in an unclean area. Do not buy bruised or damaged fruits or vegetables. Do not buy cans that have cracks, dents, or bulges. Pick up foods that can spoil at the end of your shopping trip and store them in a cooler on the way home.  Prepare and clean up foods carefully. Rinse all fresh fruits and vegetables under running water, and dry them with a clean towel or paper towel. Clean the top of cans before opening them. After preparing food, wash your hands for 20 seconds with hot water and soap. Pay special attention to areas between fingers and under nails. Clean your utensils and dishes with hot water and  soap. Disinfect your kitchen and cutting boards using 1 teaspoon of liquid, unscented bleach mixed into 1 quart of water.    Dispose of old food. Eat canned and packaged food before its expiration date (the "use by" or "best before" date). Consume refrigerated leftovers within 3 to 4 days. After that time,  throw out the food. Even if the food does not smell or look spoiled, it still may be unsafe. Some bacteria, such as Listeria, can grow even on foods stored in the refrigerator if they are kept for too long.  Take precautions when eating out. At restaurants, avoid buffets and salad bars where food sits out for a long time and comes in contact with many people. Food can become contaminated when someone with a virus, often a norovirus, or another "bug" handles it. Put any leftover food in a "to-go" container yourself, rather than having the server do it. And, refrigerate leftovers as soon as you get home. Choose restaurants that are clean and that are willing to prepare your food as you order it cooked.    SYMPTOMS TO REPORT AS SOON AS POSSIBLE AFTER TREATMENT:  FEVER GREATER THAN 100.4 F CHILLS WITH OR WITHOUT FEVER NAUSEA AND VOMITING THAT IS NOT CONTROLLED WITH YOUR NAUSEA MEDICATION UNUSUAL SHORTNESS OF BREATH UNUSUAL BRUISING OR BLEEDING TENDERNESS IN MOUTH AND THROAT WITH OR WITHOUT PRESENCE OF ULCERS URINARY PROBLEMS BOWEL PROBLEMS UNUSUAL RASH     Wear comfortable clothing and clothing appropriate for easy access to any Portacath or PICC line. Let us know if there is anything that we can do to make your therapy better!   What to do if you need assistance after hours or on the weekends: CALL [phone].  HOLD on the line, do not hang up.  You will hear multiple messages but at the end you will be connected with a nurse triage line.  They will contact the doctor if necessary.  Most of the time they will be able to assist you.  Do not call the hospital operator.    I have been  informed and understand all of the instructions given to me and have received a copy. I have been instructed to call the clinic [phone] or my family physician as soon as possible for continued medical care, if indicated. I do not have any more questions at this time but understand that I may call the [facility] or the Patient Navigator at [phone] during office hours should I have questions or need assistance in obtaining follow-up care.

## 2022-03-10 ENCOUNTER — Other Ambulatory Visit (HOSPITAL_COMMUNITY): Payer: Medicare PPO

## 2022-03-10 NOTE — Progress Notes (Signed)
Pharmacist Chemotherapy Monitoring - Initial Assessment    Anticipated start date: 03/17/22   The following has been reviewed per standard work regarding the patient's treatment regimen: The patient's diagnosis, treatment plan and drug doses, and organ/hematologic function Lab orders and baseline tests specific to treatment regimen  The treatment plan start date, drug sequencing, and pre-medications Prior authorization status  Patient's documented medication list, including drug-drug interaction screen and prescriptions for anti-emetics and supportive care specific to the treatment regimen The drug concentrations, fluid compatibility, administration routes, and timing of the medications to be used The patient's access for treatment and lifetime cumulative dose history, if applicable  The patient's medication allergies and previous infusion related reactions, if applicable   Changes made to treatment plan:  N/A  Follow up needed:  N/A   Angel Barry, [facility], 03/10/2022  1:57 PM

## 2022-03-11 ENCOUNTER — Other Ambulatory Visit: Payer: Self-pay

## 2022-03-11 ENCOUNTER — Encounter (HOSPITAL_COMMUNITY): Payer: Self-pay

## 2022-03-11 ENCOUNTER — Encounter (HOSPITAL_COMMUNITY)
Admission: RE | Admit: 2022-03-11 | Discharge: 2022-03-11 | Disposition: A | Discharge status: Home / Self Care | Payer: Medicare PPO | Source: Ambulatory Visit | Attending: General Surgery | Admitting: General Surgery

## 2022-03-11 HISTORY — DX: Prediabetes: R73.03

## 2022-03-11 NOTE — Pre-Procedure Instructions (Signed)
Attempted pre-op phonecall. Left VM for her to call us back. 

## 2022-03-12 MED ORDER — LIDOCAINE-PRILOCAINE 2.5-2.5 % EX CREA: 1.0000 | TOPICAL_CREAM | CUTANEOUS | 3 refills | Status: DC | PRN

## 2022-03-12 MED ORDER — PROCHLORPERAZINE MALEATE 10 MG PO TABS: 10.0000 mg | ORAL_TABLET | Freq: Four times a day (QID) | ORAL | 3 refills | Status: DC | PRN

## 2022-03-13 ENCOUNTER — Inpatient Hospital Stay: Payer: Medicare PPO | Attending: Hematology

## 2022-03-13 ENCOUNTER — Inpatient Hospital Stay: Payer: Medicare PPO | Admitting: Licensed Clinical Social Worker

## 2022-03-13 ENCOUNTER — Other Ambulatory Visit: Payer: Medicare PPO

## 2022-03-13 DIAGNOSIS — C50411 Malignant neoplasm of upper-outer quadrant of right female breast: Secondary | ICD-10-CM | POA: Insufficient documentation

## 2022-03-13 DIAGNOSIS — T451X5A Adverse effect of antineoplastic and immunosuppressive drugs, initial encounter: Secondary | ICD-10-CM | POA: Insufficient documentation

## 2022-03-13 DIAGNOSIS — Z171 Estrogen receptor negative status [ER-]: Secondary | ICD-10-CM

## 2022-03-13 DIAGNOSIS — D6959 Other secondary thrombocytopenia: Secondary | ICD-10-CM | POA: Insufficient documentation

## 2022-03-13 DIAGNOSIS — Z5111 Encounter for antineoplastic chemotherapy: Secondary | ICD-10-CM | POA: Insufficient documentation

## 2022-03-13 NOTE — Progress Notes (Signed)

## 2022-03-13 NOTE — Progress Notes (Signed)
[place] Clinical Social Work  Initial Assessment   Angel Barry is a 77 y.o. year old female accompanied by patient and husband. Clinical Social Work was referred by medical provider for assessment of psychosocial needs.   SDOH (Social Determinants of Health) assessments performed: Yes   SDOH Screenings   Food Insecurity: No Food Insecurity (03/05/2022)  Housing: Low Risk  (03/05/2022)  Transportation Needs: No Transportation Needs (03/05/2022)  Utilities: Not At Risk (03/05/2022)  Depression (PHQ2-9): Low Risk  (03/05/2022)  Tobacco Use: Low Risk  (03/11/2022)     Distress Screen completed: No     No data to display            Family/Social Information:  Housing Arrangement: patient lives with her husband, Angel Barry members/support persons in your life? Pt has 3 adult children; however, none are local.  Pt's oldest son is in [place], middle son in [place] and daughter is in [place].  Pt's middle son was diagnosed w/ GBM last July and following surgery, chemo and radiation is enrolled in a clinical trial at [facility].  Pt and her husband have been providing support to their son until diagnosis.  The couple is well connected to their community and have support from friends if needed.   Transportation concerns: no  Employment: Retired Pharmacist, hospital.  Income source: Paediatric nurse concerns: No Type of concern: None Food access concerns: no Religious or spiritual practice: Yes-Christian Services Currently in place:  none  Coping/ Adjustment to diagnosis: Patient understands treatment plan and what happens next? yes Concerns about diagnosis and/or treatment: Overwhelmed by information Patient reported stressors: Anxiety/ nervousness and Adjusting to my illness Hopes and/or priorities: Pt's priority is to move forward with treatment with the hope of curative results Patient enjoys time with family/ friends Current coping skills/ strengths: Capable of independent  living , Armed forces logistics/support/administrative officer , Motivation for treatment/growth , Physical Health , and Supportive family/friends     SUMMARY: Current SDOH Barriers:  No barriers identified at this time  Clinical Social Work Clinical Goal(s):  No clinical social work goals at this time  Interventions: Discussed common feeling and emotions when being diagnosed with cancer, and the importance of support during treatment Informed patient of the support team roles and support services at [facility] Provided [facility] contact information and encouraged patient to call with any questions or concerns Referred patient to Duanne Limerick for supportive services.   Follow Up Plan: Patient will contact CSW with any support or resource needs Patient verbalizes understanding of plan: Yes    Angel Combs, LCSW

## 2022-03-14 ENCOUNTER — Ambulatory Visit (HOSPITAL_COMMUNITY): Payer: Medicare PPO | Admitting: Certified Registered"

## 2022-03-14 ENCOUNTER — Ambulatory Visit (HOSPITAL_COMMUNITY): Payer: Medicare PPO

## 2022-03-14 ENCOUNTER — Ambulatory Visit (HOSPITAL_BASED_OUTPATIENT_CLINIC_OR_DEPARTMENT_OTHER): Payer: Medicare PPO | Admitting: Certified Registered"

## 2022-03-14 ENCOUNTER — Encounter (HOSPITAL_COMMUNITY)
Admission: RE | Disposition: A | Discharge status: Home / Self Care | Payer: Self-pay | Source: Home / Self Care | Attending: General Surgery

## 2022-03-14 ENCOUNTER — Ambulatory Visit (HOSPITAL_COMMUNITY)
Admission: RE | Admit: 2022-03-14 | Discharge: 2022-03-14 | Disposition: A | Discharge status: Home / Self Care | Payer: Medicare PPO | Attending: General Surgery | Admitting: General Surgery

## 2022-03-14 ENCOUNTER — Encounter (HOSPITAL_COMMUNITY): Payer: Self-pay | Admitting: General Surgery

## 2022-03-14 ENCOUNTER — Other Ambulatory Visit: Payer: Self-pay

## 2022-03-14 DIAGNOSIS — C50911 Malignant neoplasm of unspecified site of right female breast: Secondary | ICD-10-CM | POA: Diagnosis not present

## 2022-03-14 DIAGNOSIS — I10 Essential (primary) hypertension: Secondary | ICD-10-CM | POA: Diagnosis not present

## 2022-03-14 DIAGNOSIS — E7800 Pure hypercholesterolemia, unspecified: Secondary | ICD-10-CM | POA: Diagnosis not present

## 2022-03-14 DIAGNOSIS — K219 Gastro-esophageal reflux disease without esophagitis: Secondary | ICD-10-CM | POA: Diagnosis not present

## 2022-03-14 DIAGNOSIS — Z171 Estrogen receptor negative status [ER-]: Secondary | ICD-10-CM | POA: Diagnosis not present

## 2022-03-14 DIAGNOSIS — C50411 Malignant neoplasm of upper-outer quadrant of right female breast: Secondary | ICD-10-CM

## 2022-03-14 DIAGNOSIS — Z452 Encounter for adjustment and management of vascular access device: Secondary | ICD-10-CM | POA: Diagnosis not present

## 2022-03-14 HISTORY — PX: PORTACATH PLACEMENT: SHX2246

## 2022-03-14 SURGERY — INSERTION, TUNNELED CENTRAL VENOUS DEVICE, WITH PORT
Anesthesia: General | Site: Chest | Laterality: Left

## 2022-03-14 MED ORDER — FENTANYL CITRATE (PF) 100 MCG/2ML IJ SOLN
INTRAMUSCULAR | Status: AC
  Filled 2022-03-14: qty 2

## 2022-03-14 MED ORDER — EPHEDRINE SULFATE (PRESSORS) 50 MG/ML IJ SOLN
INTRAMUSCULAR | Status: DC | PRN
  Administered 2022-03-14 (×3): 5 mg via INTRAVENOUS

## 2022-03-14 MED ORDER — CHLORHEXIDINE GLUCONATE 0.12 % MT SOLN
15.0000 mL | Freq: Once | OROMUCOSAL | Status: AC
  Administered 2022-03-14: 15 mL via OROMUCOSAL

## 2022-03-14 MED ORDER — CHLORHEXIDINE GLUCONATE CLOTH 2 % EX PADS: 6.0000 | MEDICATED_PAD | Freq: Once | CUTANEOUS | Status: DC

## 2022-03-14 MED ORDER — PROPOFOL 10 MG/ML IV BOLUS
INTRAVENOUS | Status: DC | PRN
  Administered 2022-03-14: 20 mg via INTRAVENOUS
  Administered 2022-03-14: 50 mg via INTRAVENOUS
  Administered 2022-03-14: 100 mg via INTRAVENOUS

## 2022-03-14 MED ORDER — LIDOCAINE HCL (PF) 1 % IJ SOLN
INTRAMUSCULAR | Status: AC
  Filled 2022-03-14: qty 30

## 2022-03-14 MED ORDER — HEPARIN SOD (PORK) LOCK FLUSH 100 UNIT/ML IV SOLN
INTRAVENOUS | Status: DC | PRN
  Administered 2022-03-14: 500 [IU] via INTRAVENOUS

## 2022-03-14 MED ORDER — SODIUM CHLORIDE (PF) 0.9 % IJ SOLN
INTRAMUSCULAR | Status: DC | PRN
  Administered 2022-03-14: 10 mL

## 2022-03-14 MED ORDER — HEPARIN SOD (PORK) LOCK FLUSH 100 UNIT/ML IV SOLN
INTRAVENOUS | Status: AC
  Filled 2022-03-14: qty 5

## 2022-03-14 MED ORDER — LACTATED RINGERS IV SOLN
INTRAVENOUS | Status: DC
  Administered 2022-03-14: 1000 mL via INTRAVENOUS

## 2022-03-14 MED ORDER — CEFAZOLIN SODIUM-DEXTROSE 2-4 GM/100ML-% IV SOLN
2.0000 g | INTRAVENOUS | Status: AC
  Administered 2022-03-14: 2 g via INTRAVENOUS
  Filled 2022-03-14: qty 100

## 2022-03-14 MED ORDER — PROPOFOL 500 MG/50ML IV EMUL
INTRAVENOUS | Status: DC | PRN
  Administered 2022-03-14: 50 ug/kg/min via INTRAVENOUS

## 2022-03-14 MED ORDER — LIDOCAINE HCL (PF) 1 % IJ SOLN
INTRAMUSCULAR | Status: DC | PRN
  Administered 2022-03-14: 7 mL via INTRADERMAL

## 2022-03-14 MED ORDER — ONDANSETRON HCL 4 MG/2ML IJ SOLN
INTRAMUSCULAR | Status: DC | PRN
  Administered 2022-03-14: 4 mg via INTRAVENOUS

## 2022-03-14 MED ORDER — PROPOFOL 10 MG/ML IV BOLUS
INTRAVENOUS | Status: AC
  Filled 2022-03-14: qty 20

## 2022-03-14 MED ORDER — EPHEDRINE 5 MG/ML INJ
INTRAVENOUS | Status: AC
  Filled 2022-03-14: qty 5

## 2022-03-14 MED ORDER — LIDOCAINE HCL (PF) 2 % IJ SOLN
INTRAMUSCULAR | Status: AC
  Filled 2022-03-14: qty 5

## 2022-03-14 MED ORDER — FENTANYL CITRATE (PF) 100 MCG/2ML IJ SOLN
INTRAMUSCULAR | Status: DC | PRN
  Administered 2022-03-14 (×4): 25 ug via INTRAVENOUS

## 2022-03-14 MED ORDER — ONDANSETRON HCL 4 MG/2ML IJ SOLN
INTRAMUSCULAR | Status: AC
  Filled 2022-03-14: qty 2

## 2022-03-14 MED ORDER — ORAL CARE MOUTH RINSE: 15.0000 mL | Freq: Once | OROMUCOSAL | Status: AC

## 2022-03-14 SURGICAL SUPPLY — 35 items
ADH SKN CLS APL DERMABOND .7 (GAUZE/BANDAGES/DRESSINGS) ×1
APL PRP STRL LF ISPRP CHG 10.5 (MISCELLANEOUS) ×1
APPLICATOR CHLORAPREP 10.5 ORG (MISCELLANEOUS) ×1 IMPLANT
APPLIER CLIP 9.375 SM OPEN (CLIP)
APR CLP SM 9.3 20 MLT OPN (CLIP)
BAG DECANTER FOR FLEXI CONT (MISCELLANEOUS) ×1 IMPLANT
CLIP APPLIE 9.375 SM OPEN (CLIP) IMPLANT
CLOTH BEACON ORANGE TIMEOUT ST (SAFETY) ×1 IMPLANT
COVER LIGHT HANDLE STERIS (MISCELLANEOUS) ×2 IMPLANT
DECANTER SPIKE VIAL GLASS SM (MISCELLANEOUS) ×1 IMPLANT
DERMABOND ADVANCED .7 DNX12 (GAUZE/BANDAGES/DRESSINGS) ×1 IMPLANT
DRAPE C-ARM FOLDED MOBILE STRL (DRAPES) ×1 IMPLANT
ELECT REM PT RETURN 9FT ADLT (ELECTROSURGICAL) ×1
ELECTRODE REM PT RTRN 9FT ADLT (ELECTROSURGICAL) ×1 IMPLANT
GAUZE 4X4 16PLY ~~LOC~~+RFID DBL (SPONGE) ×1 IMPLANT
GLOVE BIO SURGEON STRL SZ 6.5 (GLOVE) ×1 IMPLANT
GLOVE BIOGEL PI IND STRL 6.5 (GLOVE) ×1 IMPLANT
GLOVE BIOGEL PI IND STRL 7.0 (GLOVE) ×2 IMPLANT
GOWN STRL REUS W/TWL LRG LVL3 (GOWN DISPOSABLE) ×2 IMPLANT
IV NS 500ML (IV SOLUTION) ×1
IV NS 500ML BAXH (IV SOLUTION) ×1 IMPLANT
KIT PORT POWER 8FR ISP MRI (Port) ×1 IMPLANT
KIT TURNOVER KIT A (KITS) ×1 IMPLANT
MANIFOLD NEPTUNE II (INSTRUMENTS) ×1 IMPLANT
NDL HYPO 25X1 1.5 SAFETY (NEEDLE) ×1 IMPLANT
NEEDLE HYPO 25X1 1.5 SAFETY (NEEDLE) ×1 IMPLANT
PACK MINOR (CUSTOM PROCEDURE TRAY) ×1 IMPLANT
PAD ARMBOARD 7.5X6 YLW CONV (MISCELLANEOUS) ×1 IMPLANT
SET BASIN LINEN APH (SET/KITS/TRAYS/PACK) ×1 IMPLANT
SUT MNCRL AB 4-0 PS2 18 (SUTURE) ×1 IMPLANT
SUT PROLENE 2 0 SH 30 (SUTURE) ×1 IMPLANT
SUT VIC AB 3-0 SH 27 (SUTURE) ×1
SUT VIC AB 3-0 SH 27X BRD (SUTURE) ×1 IMPLANT
SYR 10ML LL (SYRINGE) ×2 IMPLANT
SYR CONTROL 10ML LL (SYRINGE) ×1 IMPLANT

## 2022-03-14 NOTE — Op Note (Signed)
Operative Note 03/14/22   Preoperative Diagnosis: Right breast cancer   Postoperative Diagnosis: Same   Procedure(s) Performed: Port-A-Cath placement, left subclavian   Surgeon: Lanell Matar. Constance Haw, MD   Assistants: No qualified resident was available   Anesthesia: Monitored anesthesia care   Anesthesiologist: Dr. Briant Cedar, MD    Specimens: None   Estimated Blood Loss: Minimal   Fluoroscopy time: 12 seconds   Blood Replacement: None    Complications: None    Operative Findings: Normal anatomy  Indications: Ms. Magruder is a 77 yo with a right sided breast cancer, triple negative that will need to do adjuvant therapy. We discussed risk of bleeding, infection, pneumothorax, injury to vessels.  Procedure: The patient was brought into the operating room and monitored anesthesia care was induced.  One percent lidocaine was used for local anesthesia.   The left chest and neck was prepped and draped in the usual sterile fashion.  Preoperative antibiotics were given.  An incision was made below the left clavicle. A subcutaneous pocket was formed. The needles advanced into the subclavian vein using the Seldinger technique without difficulty. A guidewire was then advanced into the right atrium under fluoroscopic guidance.  Ectopia was not noted. An introducer and peel-away sheath were placed over the guidewire. The catheter was then inserted through the peel-away sheath and the peel-away sheath was removed.  A spot film was performed to confirm the position. The catheter was then attached to the port and the port placed in subcutaneous pocket. Adequate positioning was confirmed by fluoroscopy. Hemostasis was confirmed, and the port was secured with 2-0 prolene sutures.  Good backflow of blood was noted on aspiration of the port. The port was flushed with heparin flush. Subcutaneous layer was reapproximated using a 3-0 Vicryl interrupted suture. The skin was closed using a 4-0 Vicryl subcuticular  suture. Dermabond was applied.    All tape and needle counts were correct at the end of the procedure. The patient was transferred to PACU in stable condition. A chest x-ray will be performed at that time.  Curlene Labrum, MD [facility] [address] [place],  [id number] [phone] (office)

## 2022-03-14 NOTE — Anesthesia Procedure Notes (Signed)
Date/Time: 03/14/2022 10:57 AM  Performed by: Vista Deck, CRNAPre-anesthesia Checklist: Patient identified, Emergency Drugs available, Suction available, Timeout performed and Patient being monitored Patient Re-evaluated:Patient Re-evaluated prior to induction Oxygen Delivery Method: Nasal Cannula

## 2022-03-14 NOTE — Transfer of Care (Signed)
Immediate Anesthesia Transfer of Care Note  Patient: Angel Barry  Procedure(s) Performed: INSERTION PORT-A-CATH (Left: Chest)  Patient Location: PACU  Anesthesia Type:General  Level of Consciousness: awake and patient cooperative  Airway & Oxygen Therapy: Patient Spontanous Breathing  Post-op Assessment: Report given to RN and Post -op Vital signs reviewed and stable  Post vital signs: Reviewed and stable  Last Vitals:  Vitals Value Taken Time  BP 112/61 1143  Temp 97.6 1143  Pulse 76 1143  Resp 14 1143  SpO2 100 1143    Last Pain:  Vitals:   03/14/22 0927  TempSrc: Oral  PainSc: 0-No pain         Complications: No notable events documented.

## 2022-03-14 NOTE — Discharge Instructions (Signed)
Keep area clean and dry. You can take a shower in 24 hours. Do not submerge in water for 4 weeks.  Take tylenol and ibuprofen for pain control and roxicodone you have at home for severe pain.

## 2022-03-14 NOTE — H&P (Signed)
[facility] History and Physical  Reason for Referral: Port placement   Angel Barry is a 77 y.o. female.  HPI: Patient with a right breast cancer triple negative. She will need chemotherapy and is set up for a port today. She is otherwise healing well and no major issues.   Past Medical History:  Diagnosis Date   Arthritis    GERD (gastroesophageal reflux disease)    Hypercholesteremia    Hypertension    Pre-diabetes     Past Surgical History:  Procedure Laterality Date   BREAST BIOPSY Right 01/14/2022   [place] RT BREAST BX W LOC DEV 1ST LESION IMG BX SPEC US GUIDE 01/14/2022 Everlean Alstrom, MD [facility]-ULTRASOUND   CATARACT EXTRACTION W/PHACO Left 12/18/2016   Procedure: CATARACT EXTRACTION PHACO AND INTRAOCULAR LENS PLACEMENT ([place]);  Surgeon: Baruch Goldmann, MD;  Location: [facility];  Service: Ophthalmology;  Laterality: Left;  CDE: 9.55   CATARACT EXTRACTION W/PHACO Right 08/06/2018   Procedure: CATARACT EXTRACTION PHACO AND INTRAOCULAR LENS PLACEMENT (IOC);  Surgeon: Baruch Goldmann, MD;  Location: [facility];  Service: Ophthalmology;  Laterality: Right;  CDE: 13.46   PARTIAL MASTECTOMY WITH AXILLARY SENTINEL LYMPH NODE BIOPSY Right 02/05/2022   TUBAL LIGATION      Family History  Problem Relation Age of Onset   Atrial fibrillation Mother    Leukemia Mother    Irregular heart beat Father    Brain cancer Son     Social History   Tobacco Use   Smoking status: Never   Smokeless tobacco: Never  Vaping Use   Vaping Use: Never used  Substance Use Topics   Alcohol use: Yes    Comment: very rarely   Drug use: No    Medications: I have reviewed the patient's current medications. Current Outpatient Medications  Medication Instructions   acetaminophen (TYLENOL) 500 mg, Oral, Every 6 hours PRN   ascorbic acid (VITAMIN C) 500 mg, Oral, Every morning   cetirizine (ZYRTEC) 10 mg, Oral, Daily PRN   [START ON 03/17/2022] CYCLOPHOSPHAMIDE IV Intravenous, Every 21 days    [START ON 03/17/2022] DOCETAXEL IV Intravenous, Every 21 days   ELDERBERRY PO 10 mLs, Oral, Every morning   fluticasone (FLONASE) 50 MCG/ACT nasal spray 2 sprays, Each Nare, Every morning   fosinopril-hydrochlorothiazide (MONOPRIL-HCT) 20-12.5 MG tablet 1 tablet, Oral, Daily at bedtime   lidocaine-prilocaine (EMLA) cream 1 Application, Topical, As needed, Apply a quarter-sized amount to port a cath site and cover with plastic wrap one hour prior to infusion appointments   pantoprazole (PROTONIX) 40 mg, Oral, Daily before breakfast   polyethylene glycol (MIRALAX / GLYCOLAX) 17 g, Oral, Daily at bedtime   Polyethylene Glycol 400 (BLINK TEARS OP) 2 drops, Both Eyes, Daily PRN   prochlorperazine (COMPAZINE) 10 mg, Oral, Every 6 hours PRN   simvastatin (ZOCOR) 10 mg, Oral, Every evening   Vitamin D 2,000 Units, Oral, Every morning   Zinc 50 mg, Oral, Every morning    Allergies  Allergen Reactions   Macrobid [Nitrofurantoin Macrocrystal] Nausea And Vomiting      ROS:  A comprehensive review of systems was negative except for: Integument/breast: positive for right breast cancer  Blood pressure 126/78, pulse 64, temperature 98.1 F (36.7 C), temperature source Oral, resp. rate 16, SpO2 100 %. Physical Exam Vitals reviewed.  Constitutional:      Appearance: Normal appearance.  HENT:     Head: Normocephalic.     Nose: Nose normal.  Eyes:     Extraocular Movements: Extraocular  movements intact.  Cardiovascular:     Rate and Rhythm: Normal rate.  Pulmonary:     Effort: Pulmonary effort is normal.  Abdominal:     General: There is no distension.     Palpations: Abdomen is soft.     Tenderness: There is no abdominal tenderness.  Musculoskeletal:        General: Normal range of motion.     Cervical back: Normal range of motion.  Skin:    General: Skin is warm.  Neurological:     General: No focal deficit present.     Mental Status: She is alert and oriented to person, place, and  time.  Psychiatric:        Mood and Affect: Mood normal.        Behavior: Behavior normal.        Thought Content: Thought content normal.     Results:  None   Assessment & Plan:  Angel Barry is a 77 y.o. female with a right sided carcinoma that needs a port placed. Discussed risk of bleeding, infection, pneumothorax and use of [place] and fluorsoscopy for the placement.     All questions were answered to the satisfaction of the patient.    Virl Cagey 03/14/2022, 10:29 AM

## 2022-03-14 NOTE — Anesthesia Preprocedure Evaluation (Signed)
Anesthesia Evaluation  Patient identified by MRN, date of birth, ID band Patient awake    Reviewed: Allergy & Precautions, H&P , NPO status , Patient's Chart, lab work & pertinent test results, reviewed documented beta blocker date and time   Airway Mallampati: II  TM Distance: >3 FB Neck ROM: full    Dental no notable dental hx.    Pulmonary neg pulmonary ROS   Pulmonary exam normal breath sounds clear to auscultation       Cardiovascular Exercise Tolerance: Good hypertension, negative cardio ROS  Rhythm:regular Rate:Normal     Neuro/Psych negative neurological ROS  negative psych ROS   GI/Hepatic negative GI ROS, Neg liver ROS,GERD  ,,  Endo/Other  negative endocrine ROS    Renal/GU negative Renal ROS  negative genitourinary   Musculoskeletal   Abdominal   Peds  Hematology negative hematology ROS (+)   Anesthesia Other Findings   Reproductive/Obstetrics negative OB ROS                             Anesthesia Physical Anesthesia Plan  ASA: 2  Anesthesia Plan: General   Post-op Pain Management:    Induction:   PONV Risk Score and Plan: Propofol infusion  Airway Management Planned:   Additional Equipment:   Intra-op Plan:   Post-operative Plan:   Informed Consent: I have reviewed the patients History and Physical, chart, labs and discussed the procedure including the risks, benefits and alternatives for the proposed anesthesia with the patient or authorized representative who has indicated his/her understanding and acceptance.     Dental Advisory Given  Plan Discussed with: CRNA  Anesthesia Plan Comments:        Anesthesia Quick Evaluation  

## 2022-03-17 ENCOUNTER — Inpatient Hospital Stay: Payer: Medicare PPO

## 2022-03-17 ENCOUNTER — Inpatient Hospital Stay: Payer: Medicare PPO | Admitting: Dietician

## 2022-03-17 ENCOUNTER — Other Ambulatory Visit: Payer: Medicare PPO

## 2022-03-17 VITALS — BP 111/70 | HR 64 | Temp 97.4°F | Resp 18

## 2022-03-17 DIAGNOSIS — Z5111 Encounter for antineoplastic chemotherapy: Secondary | ICD-10-CM | POA: Diagnosis not present

## 2022-03-17 DIAGNOSIS — T451X5A Adverse effect of antineoplastic and immunosuppressive drugs, initial encounter: Secondary | ICD-10-CM | POA: Diagnosis not present

## 2022-03-17 DIAGNOSIS — C50411 Malignant neoplasm of upper-outer quadrant of right female breast: Secondary | ICD-10-CM | POA: Diagnosis not present

## 2022-03-17 DIAGNOSIS — Z171 Estrogen receptor negative status [ER-]: Secondary | ICD-10-CM | POA: Diagnosis not present

## 2022-03-17 DIAGNOSIS — D6959 Other secondary thrombocytopenia: Secondary | ICD-10-CM | POA: Diagnosis not present

## 2022-03-17 LAB — COMPREHENSIVE METABOLIC PANEL
ALT: 15 U/L (ref 0–44)
AST: 21 U/L (ref 15–41)
Albumin: 4 g/dL (ref 3.5–5.0)
Alkaline Phosphatase: 47 U/L (ref 38–126)
Anion gap: 8 (ref 5–15)
BUN: 14 mg/dL (ref 8–23)
CO2: 26 mmol/L (ref 22–32)
Calcium: 8.8 mg/dL — ABNORMAL LOW (ref 8.9–10.3)
Chloride: 104 mmol/L (ref 98–111)
Creatinine, Ser: 0.77 mg/dL (ref 0.44–1.00)
GFR, Estimated: >60 mL/min (ref >60)
Glucose, Bld: 119 mg/dL — ABNORMAL HIGH (ref 70–99)
Potassium: 3.4 mmol/L — ABNORMAL LOW (ref 3.5–5.1)
Sodium: 138 mmol/L (ref 135–145)
Total Bilirubin: 1.1 mg/dL (ref 0.3–1.2)
Total Protein: 6.8 g/dL (ref 6.5–8.1)

## 2022-03-17 LAB — CBC WITH DIFFERENTIAL/PLATELET
Abs Immature Granulocytes: 0.02 10*3/uL (ref 0.00–0.07)
Basophils Absolute: 0.1 10*3/uL (ref 0.0–0.1)
Basophils Relative: 1 %
Eosinophils Absolute: 0.1 10*3/uL (ref 0.0–0.5)
Eosinophils Relative: 1 %
HCT: 41 % (ref 36.0–46.0)
Hemoglobin: 14.1 g/dL (ref 12.0–15.0)
Immature Granulocytes: 0 %
Lymphocytes Relative: 25 %
Lymphs Abs: 1.9 10*3/uL (ref 0.7–4.0)
MCH: 31.8 pg (ref 26.0–34.0)
MCHC: 34.4 g/dL (ref 30.0–36.0)
MCV: 92.3 fL (ref 80.0–100.0)
Monocytes Absolute: 0.7 10*3/uL (ref 0.1–1.0)
Monocytes Relative: 9 %
Neutro Abs: 4.9 10*3/uL (ref 1.7–7.7)
Neutrophils Relative %: 64 %
Platelets: 144 10*3/uL — ABNORMAL LOW (ref 150–400)
RBC: 4.44 MIL/uL (ref 3.87–5.11)
RDW: 12.5 % (ref 11.5–15.5)
WBC: 7.7 10*3/uL (ref 4.0–10.5)
nRBC: 0 % (ref 0.0–0.2)

## 2022-03-17 LAB — MAGNESIUM: Magnesium: 2 mg/dL (ref 1.7–2.4)

## 2022-03-17 MED ORDER — SODIUM CHLORIDE 0.9% FLUSH
10.0000 mL | INTRAVENOUS | Status: DC | PRN
  Administered 2022-03-17: 10 mL

## 2022-03-17 MED ORDER — HEPARIN SOD (PORK) LOCK FLUSH 100 UNIT/ML IV SOLN
500.0000 [IU] | Freq: Once | INTRAVENOUS | Status: AC | PRN
  Administered 2022-03-17: 500 [IU]

## 2022-03-17 MED ORDER — SODIUM CHLORIDE 0.9 % IV SOLN
60.0000 mg/m2 | Freq: Once | INTRAVENOUS | Status: AC
  Administered 2022-03-17: 99 mg via INTRAVENOUS
  Filled 2022-03-17: qty 9.9

## 2022-03-17 MED ORDER — PALONOSETRON HCL INJECTION 0.25 MG/5ML
0.2500 mg | Freq: Once | INTRAVENOUS | Status: AC
  Administered 2022-03-17: 0.25 mg via INTRAVENOUS
  Filled 2022-03-17: qty 5

## 2022-03-17 MED ORDER — SODIUM CHLORIDE 0.9 % IV SOLN
480.0000 mg/m2 | Freq: Once | INTRAVENOUS | Status: AC
  Administered 2022-03-17: 800 mg via INTRAVENOUS
  Filled 2022-03-17: qty 40

## 2022-03-17 MED ORDER — SODIUM CHLORIDE 0.9 % IV SOLN: Freq: Once | INTRAVENOUS | Status: AC

## 2022-03-17 MED ORDER — SODIUM CHLORIDE 0.9 % IV SOLN
10.0000 mg | Freq: Once | INTRAVENOUS | Status: AC
  Administered 2022-03-17: 10 mg via INTRAVENOUS
  Filled 2022-03-17: qty 10

## 2022-03-17 NOTE — Patient Instructions (Signed)
[facility]  Discharge Instructions: Thank you for choosing [facility] to provide your oncology and hematology care.  If you have a lab appointment with the [facility], please come in thru the Main Entrance and check in at the main information desk.  Wear comfortable clothing and clothing appropriate for easy access to any Portacath or PICC line.   We strive to give you quality time with your provider. You may need to reschedule your appointment if you arrive late (15 or more minutes).  Arriving late affects you and other patients whose appointments are after yours.  Also, if you miss three or more appointments without notifying the office, you may be dismissed from the clinic at the provider's discretion.      For prescription refill requests, have your pharmacy contact our office and allow 72 hours for refills to be completed.    Today you received the following chemotherapy and/or immunotherapy agents Docetaxel and Cytoxan   To help prevent nausea and vomiting after your treatment, we encourage you to take your nausea medication as directed.  Docetaxel Injection What is this medication? DOCETAXEL (doe se TAX el) treats some types of cancer. It works by slowing down the growth of cancer cells. This medicine may be used for other purposes; ask your health care provider or pharmacist if you have questions. COMMON BRAND NAME(S): Docefrez, Taxotere What should I tell my care team before I take this medication? They need to know if you have any of these conditions: Kidney disease Liver disease Low white blood cell levels Tingling of the fingers or toes or other nerve disorder An unusual or allergic reaction to docetaxel, polysorbate 80, other medications, foods, dyes, or preservatives Pregnant or trying to get pregnant Breast-feeding How should I use this medication? This medication is injected into a vein. It is given by your care team in a hospital  or clinic setting. Talk to your care team about the use of this medication in children. Special care may be needed. Overdosage: If you think you have taken too much of this medicine contact a poison control center or emergency room at once. NOTE: This medicine is only for you. Do not share this medicine with others. What if I miss a dose? Keep appointments for follow-up doses. It is important not to miss your dose. Call your care team if you are unable to keep an appointment. What may interact with this medication? Do not take this medication with any of the following: Live virus vaccines This medication may also interact with the following: Certain antibiotics, such as clarithromycin, telithromycin Certain antivirals for HIV or hepatitis Certain medications for fungal infections, such as itraconazole, ketoconazole, voriconazole Grapefruit juice Nefazodone Supplements, such as St. John's wort This list may not describe all possible interactions. Give your health care provider a list of all the medicines, herbs, non-prescription drugs, or dietary supplements you use. Also tell them if you smoke, drink alcohol, or use illegal drugs. Some items may interact with your medicine. What should I watch for while using this medication? This medication may make you feel generally unwell. This is not uncommon as chemotherapy can affect healthy cells as well as cancer cells. Report any side effects. Continue your course of treatment even though you feel ill unless your care team tells you to stop. You may need blood work done while you are taking this medication. This medication can cause serious side effects and infusion reactions. To reduce the risk,  your care team may give you other medications to take before receiving this one. Be sure to follow the directions from your care team. This medication may increase your risk of getting an infection. Call your care team for advice if you get a fever, chills,  sore throat, or other symptoms of a cold or flu. Do not treat yourself. Try to avoid being around people who are sick. Avoid taking medications that contain aspirin, acetaminophen, ibuprofen, naproxen, or ketoprofen unless instructed by your care team. These medications may hide a fever. Be careful brushing or flossing your teeth or using a toothpick because you may get an infection or bleed more easily. If you have any dental work done, tell your dentist you are receiving this medication. Some products may contain alcohol. Ask your care team if this medication contains alcohol. Be sure to tell all care teams you are taking this medicine. Certain medications, like metronidazole and disulfiram, can cause an unpleasant reaction when taken with alcohol. The reaction includes flushing, headache, nausea, vomiting, sweating, and increased thirst. The reaction can last from 30 minutes to several hours. This medication may affect your coordination, reaction time, or judgement. Do not drive or operate machinery until you know how this medication affects you. Sit up or stand slowly to reduce the risk of dizzy or fainting spells. Drinking alcohol with this medication can increase the risk of these side effects. Talk to your care team about your risk of cancer. You may be more at risk for certain types of cancer if you take this medication. Talk to your care team if you wish to become pregnant or think you might be pregnant. This medication can cause serious birth defects if taken during pregnancy or if you get pregnant within 2 months after stopping therapy. A negative pregnancy test is required before starting this medication. A reliable form of contraception is recommended while taking this medication and for 2 months after stopping it. Talk to your care team about reliable forms of contraception. Do not breast-feed while taking this medication and for 1 week after stopping therapy. Use a condom during sex and for 4  months after stopping therapy. Tell your care team right away if you think your partner might be pregnant. This medication can cause serious birth defects. This medication may cause infertility. Talk to your care team if you are concerned about your fertility. What side effects may I notice from receiving this medication? Side effects that you should report to your care team as soon as possible: Allergic reactions--skin rash, itching, hives, swelling of the face, lips, tongue, or throat Change in vision such as blurry vision, seeing halos around lights, vision loss Infection--fever, chills, cough, or sore throat Infusion reactions--chest pain, shortness of breath or trouble breathing, feeling faint or lightheaded Low red blood cell level--unusual weakness or fatigue, dizziness, headache, trouble breathing Pain, tingling, or numbness in the hands or feet Painful swelling, warmth, or redness of the skin, blisters or sores at the infusion site Redness, blistering, peeling, or loosening of the skin, including inside the mouth Sudden or severe stomach pain, bloody diarrhea, fever, nausea, vomiting Swelling of the ankles, hands, or feet Tumor lysis syndrome (TLS)--nausea, vomiting, diarrhea, decrease in the amount of urine, dark urine, unusual weakness or fatigue, confusion, muscle pain or cramps, fast or irregular heartbeat, joint pain Unusual bruising or bleeding Side effects that usually do not require medical attention (report to your care team if they continue or are bothersome): Change in nail  shape, thickness, or color Change in taste Hair loss Increased tears This list may not describe all possible side effects. Call your doctor for medical advice about side effects. You may report side effects to FDA at 1-800-FDA-1088. Where should I keep my medication? This medication is given in a hospital or clinic. It will not be stored at home. NOTE: This sheet is a summary. It may not cover all  possible information. If you have questions about this medicine, talk to your doctor, pharmacist, or health care provider.  2023 Elsevier/Gold Standard (2007-02-13 00:00:00)  Cyclophosphamide Injection What is this medication? CYCLOPHOSPHAMIDE (sye kloe FOSS fa mide) treats some types of cancer. It works by slowing down the growth of cancer cells. This medicine may be used for other purposes; ask your health care provider or pharmacist if you have questions. COMMON BRAND NAME(S): Cyclophosphamide, Cytoxan, Neosar What should I tell my care team before I take this medication? They need to know if you have any of these conditions: Heart disease Irregular heartbeat or rhythm Infection Kidney problems Liver disease Low blood cell levels (white cells, platelets, or red blood cells) Lung disease Previous radiation Trouble passing urine An unusual or allergic reaction to cyclophosphamide, other medications, foods, dyes, or preservatives Pregnant or trying to get pregnant Breast-feeding How should I use this medication? This medication is injected into a vein. It is given by your care team in a hospital or clinic setting. Talk to your care team about the use of this medication in children. Special care may be needed. Overdosage: If you think you have taken too much of this medicine contact a poison control center or emergency room at once. NOTE: This medicine is only for you. Do not share this medicine with others. What if I miss a dose? Keep appointments for follow-up doses. It is important not to miss your dose. Call your care team if you are unable to keep an appointment. What may interact with this medication? Amphotericin B Amiodarone Azathioprine Certain antivirals for HIV or hepatitis Certain medications for blood pressure, such as enalapril, lisinopril, quinapril Cyclosporine Diuretics Etanercept Indomethacin Medications that relax  muscles Metronidazole Natalizumab Tamoxifen Warfarin This list may not describe all possible interactions. Give your health care provider a list of all the medicines, herbs, non-prescription drugs, or dietary supplements you use. Also tell them if you smoke, drink alcohol, or use illegal drugs. Some items may interact with your medicine. What should I watch for while using this medication? This medication may make you feel generally unwell. This is not uncommon as chemotherapy can affect healthy cells as well as cancer cells. Report any side effects. Continue your course of treatment even though you feel ill unless your care team tells you to stop. You may need blood work while you are taking this medication. This medication may increase your risk of getting an infection. Call your care team for advice if you get a fever, chills, sore throat, or other symptoms of a cold or flu. Do not treat yourself. Try to avoid being around people who are sick. Avoid taking medications that contain aspirin, acetaminophen, ibuprofen, naproxen, or ketoprofen unless instructed by your care team. These medications may hide a fever. Be careful brushing or flossing your teeth or using a toothpick because you may get an infection or bleed more easily. If you have any dental work done, tell your dentist you are receiving this medication. Drink water or other fluids as directed. Urinate often, even at night. Some products  may contain alcohol. Ask your care team if this medication contains alcohol. Be sure to tell all care teams you are taking this medicine. Certain medicines, like metronidazole and disulfiram, can cause an unpleasant reaction when taken with alcohol. The reaction includes flushing, headache, nausea, vomiting, sweating, and increased thirst. The reaction can last from 30 minutes to several hours. Talk to your care team if you wish to become pregnant or think you might be pregnant. This medication can cause  serious birth defects if taken during pregnancy and for 1 year after the last dose. A negative pregnancy test is required before starting this medication. A reliable form of contraception is recommended while taking this medication and for 1 year after the last dose. Talk to your care team about reliable forms of contraception. Do not father a child while taking this medication and for 4 months after the last dose. Use a condom during this time period. Do not breast-feed while taking this medication or for 1 week after the last dose. This medication may cause infertility. Talk to your care team if you are concerned about your fertility. Talk to your care team about your risk of cancer. You may be more at risk for certain types of cancer if you take this medication. What side effects may I notice from receiving this medication? Side effects that you should report to your care team as soon as possible: Allergic reactions--skin rash, itching, hives, swelling of the face, lips, tongue, or throat Dry cough, shortness of breath or trouble breathing Heart failure--shortness of breath, swelling of the ankles, feet, or hands, sudden weight gain, unusual weakness or fatigue Heart muscle inflammation--unusual weakness or fatigue, shortness of breath, chest pain, fast or irregular heartbeat, dizziness, swelling of the ankles, feet, or hands Heart rhythm changes--fast or irregular heartbeat, dizziness, feeling faint or lightheaded, chest pain, trouble breathing Infection--fever, chills, cough, sore throat, wounds that don't heal, pain or trouble when passing urine, general feeling of discomfort or being unwell Kidney injury--decrease in the amount of urine, swelling of the ankles, hands, or feet Liver injury--right upper belly pain, loss of appetite, nausea, light-colored stool, dark yellow or brown urine, yellowing skin or eyes, unusual weakness or fatigue Low red blood cell level--unusual weakness or fatigue,  dizziness, headache, trouble breathing Low sodium level--muscle weakness, fatigue, dizziness, headache, confusion Red or dark brown urine Unusual bruising or bleeding Side effects that usually do not require medical attention (report to your care team if they continue or are bothersome): Hair loss Irregular menstrual cycles or spotting Loss of appetite Nausea Pain, redness, or swelling with sores inside the mouth or throat Vomiting This list may not describe all possible side effects. Call your doctor for medical advice about side effects. You may report side effects to FDA at 1-800-FDA-1088. Where should I keep my medication? This medication is given in a hospital or clinic. It will not be stored at home. NOTE: This sheet is a summary. It may not cover all possible information. If you have questions about this medicine, talk to your doctor, pharmacist, or health care provider.  2023 Elsevier/Gold Standard (2021-02-12 00:00:00)   BELOW ARE SYMPTOMS THAT SHOULD BE REPORTED IMMEDIATELY: *FEVER GREATER THAN 100.4 F (38 C) OR HIGHER *CHILLS OR SWEATING *NAUSEA AND VOMITING THAT IS NOT CONTROLLED WITH YOUR NAUSEA MEDICATION *UNUSUAL SHORTNESS OF BREATH *UNUSUAL BRUISING OR BLEEDING *URINARY PROBLEMS (pain or burning when urinating, or frequent urination) *BOWEL PROBLEMS (unusual diarrhea, constipation, pain near the anus) TENDERNESS IN MOUTH AND  THROAT WITH OR WITHOUT PRESENCE OF ULCERS (sore throat, sores in mouth, or a toothache) UNUSUAL RASH, SWELLING OR PAIN  UNUSUAL VAGINAL DISCHARGE OR ITCHING   Items with * indicate a potential emergency and should be followed up as soon as possible or go to the Emergency Department if any problems should occur.  Please show the CHEMOTHERAPY ALERT CARD or IMMUNOTHERAPY ALERT CARD at check-in to the Emergency Department and triage nurse.  Should you have questions after your visit or need to cancel or reschedule your appointment, please contact  [facility] [phone]  and follow the prompts.  Office hours are 8:00 a.m. to 4:30 p.m. Monday - Friday. Please note that voicemails left after 4:00 p.m. may not be returned until the following business day.  We are closed weekends and major holidays. You have access to a nurse at all times for urgent questions. Please call the main number to the clinic [phone] and follow the prompts.  For any non-urgent questions, you may also contact your provider using MyChart. We now offer e-Visits for anyone 11 and older to request care online for non-urgent symptoms. For details visit [email or web address].   Also download the MyChart app! Go to the app store, search "MyChart", open the app, select [facility], and log in with your MyChart username and password.

## 2022-03-17 NOTE — Progress Notes (Signed)
Nutrition Assessment   Reason for Assessment: New Patient   ASSESSMENT: 77 year old female with right breast cancer, estrogen receptor negative. She is receiving docetaxel and cytoxan (first 3/11). Patient is under the care of Dr. Delton Coombes.   Past medical history includes arthritis, GERD, HTN, hypercholesteremia  Met with patient and husband in infusion. Introduced self and services available at [facility]. Patient reports good having a good appetite. She typically eats 3 meals and occasional snacks. Patient reports drinking a good amount of water. She is working to increase this. She denies nutrition impact symptoms at this time. Patient provided contact information. Encouraged to contact with nutrition questions or concerns.   Nutrition Focused Physical Exam: deferred   Medications: protonix, compazine, zocor   Labs: K 3.4   Anthropometrics: Patient endorses ~20 lb wt loss in the last couple of years after she and her husband were diagnosed with prediabetes.   Height: 5'3" Weight: 134 lb  UBW: 135 lb  BMI: 23.74    NUTRITION DIAGNOSIS: No nutrition diagnosis at this time    MONITORING, EVALUATION, GOAL: Patient will tolerate adequate calorie and protein energy intake to minimize weight loss during treatment    Next Visit: Monday April 1 during infusion

## 2022-03-17 NOTE — Progress Notes (Signed)
Pt presents today for C1D1 Docetaxel and Cytoxan per provider's order. Vital signs and labs WNL for treatment. Okay to proceed with treatment today.  C1D1 Docetaxel and Cytoxan given today per MD orders. Tolerated infusion without adverse affects. Vital signs stable. No complaints at this time. Discharged from clinic ambulatory in stable condition. Alert and oriented x 3. F/U with [facility] as scheduled.

## 2022-03-18 NOTE — Anesthesia Postprocedure Evaluation (Signed)
Anesthesia Post Note  Patient: Angel Barry  Procedure(s) Performed: INSERTION PORT-A-CATH (Left: Chest)  Patient location during evaluation: Phase II Anesthesia Type: General Level of consciousness: awake Pain management: pain level controlled Vital Signs Assessment: post-procedure vital signs reviewed and stable Respiratory status: spontaneous breathing and respiratory function stable Cardiovascular status: blood pressure returned to baseline and stable Postop Assessment: no headache and no apparent nausea or vomiting Anesthetic complications: no Comments: Late entry   No notable events documented.   Last Vitals:  Vitals:   03/14/22 1216 03/14/22 1222  BP: 98/69 133/75  Pulse: 60 65  Resp: (!) 9 17  Temp:  36.6 C  SpO2: 100% 100%    Last Pain:  Vitals:   03/14/22 1222  TempSrc: Oral  PainSc:                  Louann Sjogren

## 2022-03-18 NOTE — Progress Notes (Signed)
24 HOUR CHEMOTHERAPY CALL BACK:  Patient is feeling ok today.  She states she had a slight headache this morning, but it resolved with Tylenol.  She also had diarrhea yesterday evening with slight nausea, but it was also relieved by Compazine.  Patient advised to contact the office with any new symptoms or concerns.

## 2022-03-19 ENCOUNTER — Inpatient Hospital Stay: Payer: Medicare PPO

## 2022-03-19 ENCOUNTER — Other Ambulatory Visit: Payer: Self-pay

## 2022-03-19 VITALS — BP 121/62 | HR 66 | Temp 98.6°F | Resp 18

## 2022-03-19 DIAGNOSIS — Z5111 Encounter for antineoplastic chemotherapy: Secondary | ICD-10-CM | POA: Diagnosis not present

## 2022-03-19 DIAGNOSIS — C50411 Malignant neoplasm of upper-outer quadrant of right female breast: Secondary | ICD-10-CM | POA: Diagnosis not present

## 2022-03-19 DIAGNOSIS — Z171 Estrogen receptor negative status [ER-]: Secondary | ICD-10-CM

## 2022-03-19 DIAGNOSIS — D6959 Other secondary thrombocytopenia: Secondary | ICD-10-CM | POA: Diagnosis not present

## 2022-03-19 DIAGNOSIS — T451X5A Adverse effect of antineoplastic and immunosuppressive drugs, initial encounter: Secondary | ICD-10-CM | POA: Diagnosis not present

## 2022-03-19 MED ORDER — PEGFILGRASTIM-CBQV 6 MG/0.6ML ~~LOC~~ SOSY
6.0000 mg | PREFILLED_SYRINGE | Freq: Once | SUBCUTANEOUS | Status: AC
  Administered 2022-03-19: 6 mg via SUBCUTANEOUS
  Filled 2022-03-19: qty 0.6

## 2022-03-19 NOTE — Progress Notes (Signed)
Angel Barry presents today for injection per the provider's orders.  Udenyca 6 mg  administration without incident; injection site WNL; see MAR for injection details.  Patient tolerated procedure well and without incident.  No questions or complaints noted at this time.   Discharged from clinic ambulatory in stable condition. Alert and oriented x 3. F/U with [facility] as scheduled.

## 2022-03-23 NOTE — Progress Notes (Signed)
[facility] [address], [place] [address]    Clinic Day:  03/24/2022  Referring physician: [place] Nation, MD  Patient Care Team: [place] Nation, MD as PCP - General (Internal Medicine) Derek Jack, MD as Medical Oncologist (Medical Oncology) Brien Mates, RN as Oncology Nurse Navigator (Medical Oncology)   ASSESSMENT & PLAN:   Assessment: 1.  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  Social/family history: - Lives at home with her husband.  Retired Pharmacist, hospital.  Non-smoker. - Mother died of AML at age 55.  Maternal aunt had AML.  Maternal grandmother had leukemia, unknown type.  Maternal uncle had chronic leukemia.  Paternal aunt had bladder cancer.  Paternal first cousin had breast cancer.  Her son was diagnosed with GBM at age 46.  Plan: 1.  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 - She had back spasms which lasted 1 day, likely from G-CSF.  She took Tylenol which did not help much.  She no longer has pain.  Denied any GI side effects. - Denies any tingling or numbness GIST. -.  Reviewed labs today which showed mild thrombocytopenia expected from chemotherapy.  CBC otherwise is normal.  LFTs are normal.  Electrolytes are normal. - RTC 2 weeks prior to cycle 2.  Will consider dose increase based on how she does the rest of the 2 weeks.  No orders of the defined  types were placed in this encounter.     I,Alexis Herring,acting as a Education administrator for [employer], MD.,have documented all relevant documentation on the behalf of Derek Jack, MD,as directed by  Derek Jack, MD while in the presence of Derek Jack, MD.   I, Derek Jack MD, have reviewed the above documentation for accuracy and completeness, and I agree with the above.   Derek Jack, MD   3/18/20246:21 PM  CHIEF COMPLAINT:   Diagnosis: Triple negative right breast cancer    Cancer Staging  Carcinoma of upper-outer quadrant of right breast in female, estrogen receptor negative ([place]) Staging form: Breast, AJCC 8th Edition - Clinical stage from 03/04/2022: Stage IB (cT1c, cN0, cM0, G3, ER-, PR-, HER2-) - Signed by Derek Jack, MD on 03/04/2022    Prior Therapy: right lumpectomy and SLNB on 02/05/2022  Current Therapy: Docetaxel and cyclophosphamide   HISTORY OF PRESENT ILLNESS:   Oncology History  Carcinoma of upper-outer quadrant of right breast in female, estrogen receptor negative ([employer])  01/23/2022 Initial Diagnosis   Carcinoma of upper-outer quadrant of right breast in female, estrogen receptor negative ([place])   03/04/2022 Cancer Staging   Staging form: Breast, AJCC 8th Edition - Clinical stage from 03/04/2022: Stage IB (cT1c, cN0, cM0, G3, ER-, PR-, HER2-) - Signed by Derek Jack, MD on 03/04/2022 Histopathologic type: Infiltrating duct carcinoma, NOS Stage prefix: Initial diagnosis Nuclear grade: G3 Histologic grading system: 3 grade system   03/17/2022 -  Chemotherapy   Patient  is on Treatment Plan : BREAST TC q21d        INTERVAL HISTORY:   Angel Barry is a 77 y.o. female presenting to clinic today for follow up of Triple negative right breast cancer. She was last seen by me on 03/05/22 for consult.  Patient had a port placed on 03/13/32.  Today, she states that she is doing well overall. Her appetite level is at  90%. Her energy level is at 75%. She denies any GI symptoms.   PAST MEDICAL HISTORY:   Past Medical History: Past Medical History:  Diagnosis Date   Arthritis    GERD (gastroesophageal reflux disease)    Hypercholesteremia    Hypertension    Pre-diabetes     Surgical History: Past Surgical History:  Procedure Laterality Date   BREAST BIOPSY Right 01/14/2022   [place] RT BREAST BX W LOC DEV 1ST LESION IMG BX SPEC US GUIDE 01/14/2022 Everlean Alstrom, MD [facility]   CATARACT EXTRACTION W/PHACO Left 12/18/2016   Procedure: CATARACT EXTRACTION PHACO AND INTRAOCULAR LENS PLACEMENT ([place]);  Surgeon: Baruch Goldmann, MD;  Location: [facility];  Service: Ophthalmology;  Laterality: Left;  CDE: 9.55   CATARACT EXTRACTION W/PHACO Right 08/06/2018   Procedure: CATARACT EXTRACTION PHACO AND INTRAOCULAR LENS PLACEMENT ([facility]);  Surgeon: Baruch Goldmann, MD;  Location: [facility];  Service: Ophthalmology;  Laterality: Right;  CDE: 13.46   PARTIAL MASTECTOMY WITH AXILLARY SENTINEL LYMPH NODE BIOPSY Right 02/05/2022   TUBAL LIGATION      Social History: Social History   Socioeconomic History   Marital status: Married    Spouse name: Not on file   Number of children: Not on file   Years of education: Not on file   Highest education level: Not on file  Occupational History   Not on file  Tobacco Use   Smoking status: Never   Smokeless tobacco: Never  Vaping Use   Vaping Use: Never used  Substance and Sexual Activity   Alcohol use: Yes    Comment: very rarely   Drug use: No   Sexual activity: Yes    Birth control/protection: Post-menopausal  Other Topics Concern   Not on file  Social History Narrative   Not on file   Social Determinants of Health   Financial Resource Strain: Not on file  Food Insecurity: No Food Insecurity (03/05/2022)   Hunger Vital Sign    Worried About Running Out of Food in the Last Year: Never true    Ran Out of Food in the Last Year: Never true  Transportation Needs:  No Transportation Needs (03/05/2022)   PRAPARE - Transportation    Lack of Transportation (Medical): No    Lack of Transportation (Non-Medical): No  Physical Activity: Not on file  Stress: Not on file  Social Connections: Not on file  Intimate Partner Violence: Not At Risk (03/05/2022)   Humiliation, Afraid, Rape, and Kick questionnaire    Fear of Current or Ex-Partner: No    Emotionally Abused: No    Physically Abused: No    Sexually Abused: No    Family History: Family History  Problem Relation Age of Onset   Atrial fibrillation Mother    Leukemia Mother    Irregular heart beat Father    Brain cancer Son     Current Medications:  Current Outpatient Medications:    acetaminophen (TYLENOL) 500 MG tablet, Take 500 mg by mouth every 6 (six) hours as needed for moderate pain., Disp: , Rfl:    ascorbic acid (VITAMIN  C) 500 MG tablet, Take 500 mg by mouth in the morning., Disp: , Rfl:    cetirizine (ZYRTEC) 10 MG tablet, Take 10 mg by mouth daily as needed for allergies., Disp: , Rfl:    Cholecalciferol (VITAMIN D) 50 MCG (2000 UT) tablet, Take 2,000 Units by mouth in the morning., Disp: , Rfl:    CYCLOPHOSPHAMIDE IV, Inject into the vein every 21 ( twenty-one) days., Disp: , Rfl:    DOCETAXEL IV, Inject into the vein every 21 ( twenty-one) days., Disp: , Rfl:    ELDERBERRY PO, Take 10 mLs by mouth in the morning., Disp: , Rfl:    fluticasone (FLONASE) 50 MCG/ACT nasal spray, Place 2 sprays into both nostrils in the morning., Disp: , Rfl:    fosinopril-hydrochlorothiazide (MONOPRIL-HCT) 20-12.5 MG tablet, Take 1 tablet by mouth at bedtime., Disp: , Rfl:    lidocaine-prilocaine (EMLA) cream, Apply 1 Application topically as needed. Apply a quarter-sized amount to port a cath site and cover with plastic wrap one hour prior to infusion appointments, Disp: 30 g, Rfl: 3   pantoprazole (PROTONIX) 40 MG tablet, Take 40 mg by mouth daily before breakfast., Disp: , Rfl:    polyethylene glycol  (MIRALAX / GLYCOLAX) 17 g packet, Take 17 g by mouth at bedtime., Disp: , Rfl:    Polyethylene Glycol 400 (BLINK TEARS OP), Place 2 drops into both eyes daily as needed (dry/irritated eyes.)., Disp: , Rfl:    prochlorperazine (COMPAZINE) 10 MG tablet, Take 1 tablet (10 mg total) by mouth every 6 (six) hours as needed for nausea or vomiting., Disp: 60 tablet, Rfl: 3   simvastatin (ZOCOR) 10 MG tablet, Take 10 mg by mouth every evening., Disp: , Rfl:    Zinc 50 MG TABS, Take 50 mg by mouth in the morning., Disp: , Rfl:    Allergies: Allergies  Allergen Reactions   Macrobid [Nitrofurantoin Macrocrystal] Nausea And Vomiting    REVIEW OF SYSTEMS:   Review of Systems  Constitutional:  Negative for chills, fatigue and fever.  HENT:   Negative for lump/mass, mouth sores, nosebleeds, sore throat and trouble swallowing.   Eyes:  Negative for eye problems.  Respiratory:  Negative for cough and shortness of breath.   Cardiovascular:  Negative for chest pain, leg swelling and palpitations.  Gastrointestinal:  Negative for abdominal pain, constipation, diarrhea, nausea and vomiting.  Genitourinary:  Negative for bladder incontinence, difficulty urinating, dysuria, frequency, hematuria and nocturia.   Musculoskeletal:  Positive for back pain. Negative for arthralgias, flank pain, myalgias and neck pain.  Skin:  Negative for itching and rash.  Neurological:  Positive for headaches. Negative for dizziness and numbness.  Hematological:  Does not bruise/bleed easily.  Psychiatric/Behavioral:  Negative for depression, sleep disturbance and suicidal ideas. The patient is not nervous/anxious.   All other systems reviewed and are negative.    VITALS:   There were no vitals taken for this visit.  Wt Readings from Last 3 Encounters:  03/24/22 135 lb (61.2 kg)  03/17/22 134 lb (60.8 kg)  03/11/22 134 lb 3.2 oz (60.9 kg)    There is no height or weight on file to calculate BMI.  Performance status  (ECOG): 1 - Symptomatic but completely ambulatory  PHYSICAL EXAM:   Physical Exam Vitals and nursing note reviewed. Exam conducted with a chaperone present.  Constitutional:      Appearance: Normal appearance.  Cardiovascular:     Rate and Rhythm: Normal rate and regular rhythm.  Pulses: Normal pulses.     Heart sounds: Normal heart sounds.  Pulmonary:     Effort: Pulmonary effort is normal.     Breath sounds: Normal breath sounds.  Abdominal:     Palpations: Abdomen is soft. There is no hepatomegaly, splenomegaly or mass.     Tenderness: There is no abdominal tenderness.  Musculoskeletal:     Right lower leg: No edema.     Left lower leg: No edema.  Lymphadenopathy:     Cervical: No cervical adenopathy.     Right cervical: No superficial, deep or posterior cervical adenopathy.    Left cervical: No superficial, deep or posterior cervical adenopathy.     Upper Body:     Right upper body: No supraclavicular or axillary adenopathy.     Left upper body: No supraclavicular or axillary adenopathy.  Neurological:     General: No focal deficit present.     Mental Status: She is alert and oriented to person, place, and time.  Psychiatric:        Mood and Affect: Mood normal.        Behavior: Behavior normal.     LABS:      Latest Ref Rng & Units 03/24/2022   10:51 AM 03/17/2022    8:08 AM 03/06/2022    1:16 PM  CBC  WBC 4.0 - 10.5 K/uL 4.4  7.7  6.7   Hemoglobin 12.0 - 15.0 g/dL 12.4  14.1  14.4   Hematocrit 36.0 - 46.0 % 36.2  41.0  41.7   Platelets 150 - 400 K/uL 135  144  158       Latest Ref Rng & Units 03/24/2022   10:51 AM 03/17/2022    8:08 AM 03/06/2022    1:16 PM  CMP  Glucose 70 - 99 mg/dL 112  119  109   BUN 8 - 23 mg/dL 9  14  15    Creatinine 0.44 - 1.00 mg/dL 0.76  0.77  0.86   Sodium 135 - 145 mmol/L 137  138  138   Potassium 3.5 - 5.1 mmol/L 3.5  3.4  3.9   Chloride 98 - 111 mmol/L 104  104  104   CO2 22 - 32 mmol/L 26  26  25    Calcium 8.9 - 10.3  mg/dL 8.5  8.8  9.1   Total Protein 6.5 - 8.1 g/dL 6.8  6.8  7.1   Total Bilirubin 0.3 - 1.2 mg/dL 0.5  1.1  0.4   Alkaline Phos 38 - 126 U/L 53  47  49   AST 15 - 41 U/L 21  21  20    ALT 0 - 44 U/L 22  15  16       No results found for: "CEA1", "CEA" / No results found for: "CEA1", "CEA" No results found for: "PSA1" No results found for: "[id number]" No results found for: "CAN125"  No results found for: "TOTALPROTELP", "ALBUMINELP", "A1GS", "A2GS", "BETS", "BETA2SER", "GAMS", "MSPIKE", "SPEI" No results found for: "TIBC", "FERRITIN", "IRONPCTSAT" No results found for: "LDH"   STUDIES:   DG Chest Port 1 View  Result Date: 03/14/2022 CLINICAL DATA:  Postop Port-A-Cath placement. EXAM: PORTABLE CHEST 1 VIEW COMPARISON:  None Available. FINDINGS: 1155 hours. Left subclavian Port-A-Cath tip extends to the level of the superior cavoatrial junction. The heart size and mediastinal contours are normal for age with mild aortic tortuosity. The lungs appear clear. There is no pleural effusion or pneumothorax. Postsurgical changes are present within the  right breast and axilla. No acute osseous findings are seen. IMPRESSION: Left subclavian Port-A-Cath placement as described without demonstrated complication. No acute cardiopulmonary process. Electronically Signed   By: Richardean Sale M.D.   On: 03/14/2022 12:02   DG C-Arm 1-60 Min-No Report  Result Date: 03/14/2022 Fluoroscopy was utilized by the requesting physician.  No radiographic interpretation.

## 2022-03-24 ENCOUNTER — Inpatient Hospital Stay: Payer: Medicare PPO | Admitting: Hematology

## 2022-03-24 ENCOUNTER — Inpatient Hospital Stay: Payer: Medicare PPO

## 2022-03-24 DIAGNOSIS — Z5111 Encounter for antineoplastic chemotherapy: Secondary | ICD-10-CM | POA: Diagnosis not present

## 2022-03-24 DIAGNOSIS — C50411 Malignant neoplasm of upper-outer quadrant of right female breast: Secondary | ICD-10-CM

## 2022-03-24 DIAGNOSIS — T451X5A Adverse effect of antineoplastic and immunosuppressive drugs, initial encounter: Secondary | ICD-10-CM | POA: Diagnosis not present

## 2022-03-24 DIAGNOSIS — Z171 Estrogen receptor negative status [ER-]: Secondary | ICD-10-CM

## 2022-03-24 DIAGNOSIS — D6959 Other secondary thrombocytopenia: Secondary | ICD-10-CM | POA: Diagnosis not present

## 2022-03-24 LAB — COMPREHENSIVE METABOLIC PANEL
ALT: 22 U/L (ref 0–44)
AST: 21 U/L (ref 15–41)
Albumin: 3.7 g/dL (ref 3.5–5.0)
Alkaline Phosphatase: 53 U/L (ref 38–126)
Anion gap: 7 (ref 5–15)
BUN: 9 mg/dL (ref 8–23)
CO2: 26 mmol/L (ref 22–32)
Calcium: 8.5 mg/dL — ABNORMAL LOW (ref 8.9–10.3)
Chloride: 104 mmol/L (ref 98–111)
Creatinine, Ser: 0.76 mg/dL (ref 0.44–1.00)
GFR, Estimated: >60 mL/min (ref >60)
Glucose, Bld: 112 mg/dL — ABNORMAL HIGH (ref 70–99)
Potassium: 3.5 mmol/L (ref 3.5–5.1)
Sodium: 137 mmol/L (ref 135–145)
Total Bilirubin: 0.5 mg/dL (ref 0.3–1.2)
Total Protein: 6.8 g/dL (ref 6.5–8.1)

## 2022-03-24 LAB — CBC WITH DIFFERENTIAL/PLATELET
Abs Immature Granulocytes: 0.2 10*3/uL — ABNORMAL HIGH (ref 0.00–0.07)
Band Neutrophils: 6 %
Basophils Absolute: 0 10*3/uL (ref 0.0–0.1)
Basophils Relative: 1 %
Eosinophils Absolute: 0 10*3/uL (ref 0.0–0.5)
Eosinophils Relative: 0 %
HCT: 36.2 % (ref 36.0–46.0)
Hemoglobin: 12.4 g/dL (ref 12.0–15.0)
Lymphocytes Relative: 60 %
Lymphs Abs: 2.6 10*3/uL (ref 0.7–4.0)
MCH: 31.5 pg (ref 26.0–34.0)
MCHC: 34.3 g/dL (ref 30.0–36.0)
MCV: 91.9 fL (ref 80.0–100.0)
Metamyelocytes Relative: 2 %
Monocytes Absolute: 0.7 10*3/uL (ref 0.1–1.0)
Monocytes Relative: 15 %
Myelocytes: 2 %
Neutro Abs: 0.8 10*3/uL — ABNORMAL LOW (ref 1.7–7.7)
Neutrophils Relative %: 13 %
Platelets: 135 10*3/uL — ABNORMAL LOW (ref 150–400)
Promyelocytes Relative: 1 %
RBC: 3.94 MIL/uL (ref 3.87–5.11)
RDW: 12.3 % (ref 11.5–15.5)
WBC: 4.4 10*3/uL (ref 4.0–10.5)
nRBC: 0 % (ref 0.0–0.2)

## 2022-03-24 LAB — MAGNESIUM: Magnesium: 1.7 mg/dL (ref 1.7–2.4)

## 2022-03-24 MED ORDER — SODIUM CHLORIDE 0.9% FLUSH
10.0000 mL | Freq: Once | INTRAVENOUS | Status: AC
  Administered 2022-03-24: 10 mL via INTRAVENOUS

## 2022-03-24 MED ORDER — HEPARIN SOD (PORK) LOCK FLUSH 100 UNIT/ML IV SOLN
500.0000 [IU] | Freq: Once | INTRAVENOUS | Status: AC
  Administered 2022-03-24: 500 [IU] via INTRAVENOUS

## 2022-03-24 NOTE — Patient Instructions (Addendum)
[place] at [facility] Discharge Instructions   You were seen and examined today by Dr. Delton Coombes.  He reviewed the results of your lab work which are normal/stable.   You may take ibuprofen after you get the white blood cell booster shot to help with the body aches and pain.   We will see you back in 2 weeks for your next cycle of treatment.    Thank you for choosing [place] at [facility] to provide your oncology and hematology care.  To afford each patient quality time with our provider, please arrive at least 15 minutes before your scheduled appointment time.   If you have a lab appointment with the [facility] please come in thru the Main Entrance and check in at the main information desk.  You need to re-schedule your appointment should you arrive 10 or more minutes late.  We strive to give you quality time with our providers, and arriving late affects you and other patients whose appointments are after yours.  Also, if you no show three or more times for appointments you may be dismissed from the clinic at the providers discretion.     Again, thank you for choosing [facility].  Our hope is that these requests will decrease the amount of time that you wait before being seen by our physicians.       _____________________________________________________________  Should you have questions after your visit to [facility], please contact our office at [phone] and follow the prompts.  Our office hours are 8:00 a.m. and 4:30 p.m. Monday - Friday.  Please note that voicemails left after 4:00 p.m. may not be returned until the following business day.  We are closed weekends and major holidays.  You do have access to a nurse 24-7, just call the main number to the clinic [phone] and do not press any options, hold on the line and a nurse will answer the phone.    For prescription refill requests, have your  pharmacy contact our office and allow 72 hours.    Due to Covid, you will need to wear a mask upon entering the hospital. If you do not have a mask, a mask will be given to you at the Main Entrance upon arrival. For doctor visits, patients may have 1 support person age 66 or older with them. For treatment visits, patients can not have anyone with them due to social distancing guidelines and our immunocompromised population.

## 2022-03-24 NOTE — Progress Notes (Signed)
Loletha Carrow presented for Portacath access and flush. Portacath located left chest wall accessed with  H 20 needle. Good blood return present. Portacath flushed with 66ml NS  Labs drawn per orders.

## 2022-03-25 DIAGNOSIS — R7303 Prediabetes: Secondary | ICD-10-CM | POA: Diagnosis not present

## 2022-03-25 DIAGNOSIS — E7801 Familial hypercholesterolemia: Secondary | ICD-10-CM | POA: Diagnosis not present

## 2022-03-25 DIAGNOSIS — K219 Gastro-esophageal reflux disease without esophagitis: Secondary | ICD-10-CM | POA: Diagnosis not present

## 2022-03-25 DIAGNOSIS — Z6824 Body mass index (BMI) 24.0-24.9, adult: Secondary | ICD-10-CM | POA: Diagnosis not present

## 2022-03-25 DIAGNOSIS — I10 Essential (primary) hypertension: Secondary | ICD-10-CM | POA: Diagnosis not present

## 2022-03-26 ENCOUNTER — Encounter (HOSPITAL_COMMUNITY): Payer: Self-pay | Admitting: General Surgery

## 2022-03-26 NOTE — Addendum Note (Signed)
Addendum  created 03/26/22 1016 by Karna Dupes, CRNA   Intraprocedure Staff edited

## 2022-04-06 NOTE — Progress Notes (Signed)
[name] [place] [address]    Clinic Day:  04/07/2022  Referring physician: [place] Nation, MD  Patient Care Team: [place] Nation, MD as PCP - General (Internal Medicine) Derek Jack, MD as Medical Oncologist (Medical Oncology) Brien Mates, RN as Oncology Nurse Navigator (Medical Oncology)   ASSESSMENT & PLAN:   Assessment: 1.  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  Social/family history: - Lives at home with her husband.  Retired Pharmacist, hospital.  Non-smoker. - Mother died of AML at age 67.  Maternal aunt had AML.  Maternal grandmother had leukemia, unknown type.  Maternal uncle had chronic leukemia.  Paternal aunt had bladder cancer.  Paternal first cousin had breast cancer.  Her son was diagnosed with GBM at age 50.  Plan: 1.  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 - She developed back spasms after G-CSF injection.  She has nausea and felt bad for couple of days without vomiting.  Denies any tingling or numbness in extremities.  He started falling. - Reviewed labs from 04/07/2022: Normal LFTs and creatinine.  Electrolytes are normal.  CBC grossly normal. - Proceed with cycle 2 today.  Will increase dose to 90%. - RTC 3 weeks for follow-up.  No orders of the defined types were placed in this encounter.     I,Katie  Daubenspeck,acting as a Education administrator for Derek Jack, MD.,have documented all relevant documentation on the behalf of Derek Jack, MD,as directed by  Derek Jack, MD while in the presence of Derek Jack, MD.   I, Derek Jack MD, have reviewed the above documentation for accuracy and completeness, and I agree with the above.   Derek Jack, MD   4/1/20246:22 PM  CHIEF COMPLAINT:   Diagnosis: Triple negative right breast cancer    Cancer Staging  Carcinoma of upper-outer quadrant of right breast in female, estrogen receptor negative Staging form: Breast, AJCC 8th Edition - Clinical stage from 03/04/2022: Stage IB (cT1c, cN0, cM0, G3, ER-, PR-, HER2-) - Signed by Derek Jack, MD on 03/04/2022    Prior Therapy: right lumpectomy and SLNB on 02/05/2022   Current Therapy:  Docetaxel and cyclophosphamide    HISTORY OF PRESENT ILLNESS:   Oncology History  Carcinoma of upper-outer quadrant of right breast in female, estrogen receptor negative  01/23/2022 Initial Diagnosis   Carcinoma of upper-outer quadrant of right breast in female, estrogen receptor negative ([place])   03/04/2022 Cancer Staging   Staging form: Breast, AJCC 8th Edition - Clinical stage from 03/04/2022: Stage IB (cT1c, cN0, cM0, G3, ER-, PR-, HER2-) - Signed by Derek Jack, MD on 03/04/2022 Histopathologic type: Infiltrating duct carcinoma, NOS Stage prefix: Initial diagnosis Nuclear grade: G3 Histologic grading system: 3 grade system   03/17/2022 -  Chemotherapy   Patient is on Treatment Plan : BREAST TC q21d        INTERVAL HISTORY:  [name] is a 77 y.o. female presenting to clinic today for follow up of triple negative right breast cancer. She was last seen by me on 03/24/22.  Today, she states that she is doing well overall. Her appetite level is at 100%. Her energy level is at 75%.  PAST MEDICAL HISTORY:   Past Medical History: Past Medical History:   Diagnosis Date   Arthritis    GERD (gastroesophageal reflux disease)    Hypercholesteremia    Hypertension    Pre-diabetes     Surgical History: Past Surgical History:  Procedure Laterality Date   BREAST BIOPSY Right 01/14/2022   [place] RT BREAST BX W LOC DEV 1ST LESION IMG BX SPEC US GUIDE 01/14/2022 Everlean Alstrom, MD [facility]   CATARACT EXTRACTION W/PHACO Left 12/18/2016   Procedure: CATARACT EXTRACTION PHACO AND INTRAOCULAR LENS PLACEMENT ([place]);  Surgeon: Baruch Goldmann, MD;  Location: [facility];  Service: Ophthalmology;  Laterality: Left;  CDE: 9.55   CATARACT EXTRACTION W/PHACO Right 08/06/2018   Procedure: CATARACT EXTRACTION PHACO AND INTRAOCULAR LENS PLACEMENT ([facility]);  Surgeon: Baruch Goldmann, MD;  Location: [facility];  Service: Ophthalmology;  Laterality: Right;  CDE: 13.46   PARTIAL MASTECTOMY WITH AXILLARY SENTINEL LYMPH NODE BIOPSY Right 02/05/2022   PORTACATH PLACEMENT Left 03/14/2022   Procedure: INSERTION PORT-A-CATH;  Surgeon: Virl Cagey, MD;  Location: [facility];  Service: General;  Laterality: Left;   TUBAL LIGATION      Social History: Social History   Socioeconomic History   Marital status: Married    Spouse name: Not on file   Number of children: Not on file   Years of education: Not on file   Highest education level: Not on file  Occupational History   Not on file  Tobacco Use   Smoking status: Never   Smokeless tobacco: Never  Vaping Use   Vaping Use: Never used  Substance and Sexual Activity   Alcohol use: Yes    Comment: very rarely   Drug use: No   Sexual activity: Yes    Birth control/protection: Post-menopausal  Other Topics Concern   Not on file  Social History Narrative   Not on file   Social Determinants of Health   Financial Resource Strain: Not on file  Food Insecurity: No Food Insecurity (03/05/2022)   Hunger Vital Sign    Worried About Running Out of Food in the Last Year: Never true    Ran Out of Food in the Last Year: Never  true  Transportation Needs: No Transportation Needs (03/05/2022)   PRAPARE - Transportation    Lack of Transportation (Medical): No    Lack of Transportation (Non-Medical): No  Physical Activity: Not on file  Stress: Not on file  Social Connections: Not on file  Intimate Partner Violence: Not At Risk (03/05/2022)   Humiliation, Afraid, Rape, and Kick questionnaire    Fear of Current or Ex-Partner: No    Emotionally Abused: No    Physically Abused: No    Sexually Abused: No    Family History: Family History  Problem Relation Age of Onset   Atrial fibrillation Mother    Leukemia Mother    Irregular heart beat Father    Brain cancer Son     Current Medications:  Current Outpatient Medications:    acetaminophen (TYLENOL) 500 MG tablet, Take 500 mg by mouth every 6 (six) hours as needed for moderate pain., Disp: , Rfl:    ascorbic acid (VITAMIN C) 500 MG tablet, Take 500 mg by  mouth in the morning., Disp: , Rfl:    cetirizine (ZYRTEC) 10 MG tablet, Take 10 mg by mouth daily as needed for allergies., Disp: , Rfl:    Cholecalciferol (VITAMIN D) 50 MCG (2000 UT) tablet, Take 2,000 Units by mouth in the morning., Disp: , Rfl:    CYCLOPHOSPHAMIDE IV, Inject into the vein every 21 ( twenty-one) days., Disp: , Rfl:    DOCETAXEL IV, Inject into the vein every 21 ( twenty-one) days., Disp: , Rfl:    ELDERBERRY PO, Take 10 mLs by mouth in the morning., Disp: , Rfl:    fluticasone (FLONASE) 50 MCG/ACT nasal spray, Place 2 sprays into both nostrils in the morning., Disp: , Rfl:    fosinopril-hydrochlorothiazide (MONOPRIL-HCT) 20-12.5 MG tablet, Take 1 tablet by mouth at bedtime., Disp: , Rfl:    lidocaine-prilocaine (EMLA) cream, Apply 1 Application topically as needed. Apply a quarter-sized amount to port a cath site and cover with plastic wrap one hour prior to infusion appointments, Disp: 30 g, Rfl: 3   pantoprazole (PROTONIX) 40 MG tablet, Take 40 mg by mouth daily before breakfast., Disp: ,  Rfl:    polyethylene glycol (MIRALAX / GLYCOLAX) 17 g packet, Take 17 g by mouth at bedtime., Disp: , Rfl:    Polyethylene Glycol 400 (BLINK TEARS OP), Place 2 drops into both eyes daily as needed (dry/irritated eyes.)., Disp: , Rfl:    prochlorperazine (COMPAZINE) 10 MG tablet, Take 1 tablet (10 mg total) by mouth every 6 (six) hours as needed for nausea or vomiting., Disp: 60 tablet, Rfl: 3   simvastatin (ZOCOR) 10 MG tablet, Take 10 mg by mouth every evening., Disp: , Rfl:    Zinc 50 MG TABS, Take 50 mg by mouth in the morning., Disp: , Rfl:  No current facility-administered medications for this visit.  Facility-Administered Medications Ordered in Other Visits:    sodium chloride flush (NS) 0.9 % injection 10 mL, 10 mL, Intracatheter, PRN, Derek Jack, MD, 10 mL at 04/07/22 1345   Allergies: Allergies  Allergen Reactions   Macrobid [Nitrofurantoin Macrocrystal] Nausea And Vomiting    REVIEW OF SYSTEMS:   Review of Systems  Constitutional:  Negative for chills, fatigue and fever.  HENT:   Negative for lump/mass, mouth sores, nosebleeds, sore throat and trouble swallowing.   Eyes:  Negative for eye problems.  Respiratory:  Negative for cough and shortness of breath.   Cardiovascular:  Negative for chest pain, leg swelling and palpitations.  Gastrointestinal:  Negative for abdominal pain, constipation, diarrhea, nausea and vomiting.  Genitourinary:  Positive for frequency. Negative for bladder incontinence, difficulty urinating, dysuria, hematuria and nocturia.   Musculoskeletal:  Negative for arthralgias, back pain, flank pain, myalgias and neck pain.  Skin:  Negative for itching and rash.  Neurological:  Positive for dizziness and headaches. Negative for numbness.  Hematological:  Does not bruise/bleed easily.  Psychiatric/Behavioral:  Positive for sleep disturbance. Negative for depression and suicidal ideas. The patient is not nervous/anxious.   All other systems reviewed  and are negative.    VITALS:   There were no vitals taken for this visit.  Wt Readings from Last 3 Encounters:  04/07/22 136 lb 3.2 oz (61.8 kg)  03/24/22 135 lb (61.2 kg)  03/17/22 134 lb (60.8 kg)    There is no height or weight on file to calculate BMI.  Performance status (ECOG): 1 - Symptomatic but completely ambulatory  PHYSICAL EXAM:   Physical Exam Vitals and nursing note reviewed. Exam conducted  with a chaperone present.  Constitutional:      Appearance: Normal appearance.  Cardiovascular:     Rate and Rhythm: Normal rate and regular rhythm.     Pulses: Normal pulses.     Heart sounds: Normal heart sounds.  Pulmonary:     Effort: Pulmonary effort is normal.     Breath sounds: Normal breath sounds.  Abdominal:     Palpations: Abdomen is soft. There is no hepatomegaly, splenomegaly or mass.     Tenderness: There is no abdominal tenderness.  Musculoskeletal:     Right lower leg: No edema.     Left lower leg: No edema.  Lymphadenopathy:     Cervical: No cervical adenopathy.     Right cervical: No superficial, deep or posterior cervical adenopathy.    Left cervical: No superficial, deep or posterior cervical adenopathy.     Upper Body:     Right upper body: No supraclavicular or axillary adenopathy.     Left upper body: No supraclavicular or axillary adenopathy.  Neurological:     General: No focal deficit present.     Mental Status: She is alert and oriented to person, place, and time.  Psychiatric:        Mood and Affect: Mood normal.        Behavior: Behavior normal.     LABS:      Latest Ref Rng & Units 04/07/2022    8:49 AM 03/24/2022   10:51 AM 03/17/2022    8:08 AM  CBC  WBC 4.0 - 10.5 K/uL 6.4  4.4  7.7   Hemoglobin 12.0 - 15.0 g/dL 12.9  12.4  14.1   Hematocrit 36.0 - 46.0 % 37.8  36.2  41.0   Platelets 150 - 400 K/uL 178  135  144       Latest Ref Rng & Units 04/07/2022    8:49 AM 03/24/2022   10:51 AM 03/17/2022    8:08 AM  CMP  Glucose 70 -  99 mg/dL 109  112  119   BUN 8 - 23 mg/dL 18  9  14    Creatinine 0.44 - 1.00 mg/dL 0.90  0.76  0.77   Sodium 135 - 145 mmol/L 137  137  138   Potassium 3.5 - 5.1 mmol/L 3.9  3.5  3.4   Chloride 98 - 111 mmol/L 104  104  104   CO2 22 - 32 mmol/L 23  26  26    Calcium 8.9 - 10.3 mg/dL 8.8  8.5  8.8   Total Protein 6.5 - 8.1 g/dL 7.1  6.8  6.8   Total Bilirubin 0.3 - 1.2 mg/dL 0.7  0.5  1.1   Alkaline Phos 38 - 126 U/L 46  53  47   AST 15 - 41 U/L 18  21  21    ALT 0 - 44 U/L 16  22  15       No results found for: "CEA1", "CEA" / No results found for: "CEA1", "CEA" No results found for: "PSA1" No results found for: "[id number]" No results found for: "CAN125"  No results found for: "TOTALPROTELP", "ALBUMINELP", "A1GS", "A2GS", "BETS", "BETA2SER", "GAMS", "MSPIKE", "SPEI" No results found for: "TIBC", "FERRITIN", "IRONPCTSAT" No results found for: "LDH"   STUDIES:   DG Chest Port 1 View  Result Date: 03/14/2022 CLINICAL DATA:  Postop Port-A-Cath placement. EXAM: PORTABLE CHEST 1 VIEW COMPARISON:  None Available. FINDINGS: 1155 hours. Left subclavian Port-A-Cath tip extends to the level of the superior cavoatrial  junction. The heart size and mediastinal contours are normal for age with mild aortic tortuosity. The lungs appear clear. There is no pleural effusion or pneumothorax. Postsurgical changes are present within the right breast and axilla. No acute osseous findings are seen. IMPRESSION: Left subclavian Port-A-Cath placement as described without demonstrated complication. No acute cardiopulmonary process. Electronically Signed   By: Richardean Sale M.D.   On: 03/14/2022 12:02   DG C-Arm 1-60 Min-No Report  Result Date: 03/14/2022 Fluoroscopy was utilized by the requesting physician.  No radiographic interpretation.

## 2022-04-07 ENCOUNTER — Inpatient Hospital Stay: Payer: Medicare PPO | Admitting: Hematology

## 2022-04-07 ENCOUNTER — Inpatient Hospital Stay: Payer: Medicare PPO | Admitting: Dietician

## 2022-04-07 ENCOUNTER — Inpatient Hospital Stay: Payer: Medicare PPO | Attending: Hematology

## 2022-04-07 ENCOUNTER — Encounter: Payer: Self-pay | Admitting: Hematology

## 2022-04-07 ENCOUNTER — Inpatient Hospital Stay: Payer: Medicare PPO

## 2022-04-07 VITALS — BP 125/78 | HR 74 | Temp 97.5°F | Resp 18 | Wt 136.2 lb

## 2022-04-07 VITALS — BP 125/71 | HR 71 | Temp 97.5°F | Resp 16

## 2022-04-07 DIAGNOSIS — Z171 Estrogen receptor negative status [ER-]: Secondary | ICD-10-CM | POA: Diagnosis not present

## 2022-04-07 DIAGNOSIS — C50411 Malignant neoplasm of upper-outer quadrant of right female breast: Secondary | ICD-10-CM | POA: Diagnosis not present

## 2022-04-07 DIAGNOSIS — Z5189 Encounter for other specified aftercare: Secondary | ICD-10-CM | POA: Diagnosis not present

## 2022-04-07 DIAGNOSIS — Z79899 Other long term (current) drug therapy: Secondary | ICD-10-CM | POA: Diagnosis not present

## 2022-04-07 DIAGNOSIS — Z95828 Presence of other vascular implants and grafts: Secondary | ICD-10-CM

## 2022-04-07 DIAGNOSIS — Z5111 Encounter for antineoplastic chemotherapy: Secondary | ICD-10-CM | POA: Insufficient documentation

## 2022-04-07 LAB — COMPREHENSIVE METABOLIC PANEL
ALT: 16 U/L (ref 0–44)
AST: 18 U/L (ref 15–41)
Albumin: 4.1 g/dL (ref 3.5–5.0)
Alkaline Phosphatase: 46 U/L (ref 38–126)
Anion gap: 10 (ref 5–15)
BUN: 18 mg/dL (ref 8–23)
CO2: 23 mmol/L (ref 22–32)
Calcium: 8.8 mg/dL — ABNORMAL LOW (ref 8.9–10.3)
Chloride: 104 mmol/L (ref 98–111)
Creatinine, Ser: 0.9 mg/dL (ref 0.44–1.00)
GFR, Estimated: >60 mL/min (ref >60)
Glucose, Bld: 109 mg/dL — ABNORMAL HIGH (ref 70–99)
Potassium: 3.9 mmol/L (ref 3.5–5.1)
Sodium: 137 mmol/L (ref 135–145)
Total Bilirubin: 0.7 mg/dL (ref 0.3–1.2)
Total Protein: 7.1 g/dL (ref 6.5–8.1)

## 2022-04-07 LAB — CBC WITH DIFFERENTIAL/PLATELET
Abs Immature Granulocytes: 0.03 10*3/uL (ref 0.00–0.07)
Basophils Absolute: 0.1 10*3/uL (ref 0.0–0.1)
Basophils Relative: 2 %
Eosinophils Absolute: 0 10*3/uL (ref 0.0–0.5)
Eosinophils Relative: 0 %
HCT: 37.8 % (ref 36.0–46.0)
Hemoglobin: 12.9 g/dL (ref 12.0–15.0)
Immature Granulocytes: 1 %
Lymphocytes Relative: 25 %
Lymphs Abs: 1.6 10*3/uL (ref 0.7–4.0)
MCH: 31.6 pg (ref 26.0–34.0)
MCHC: 34.1 g/dL (ref 30.0–36.0)
MCV: 92.6 fL (ref 80.0–100.0)
Monocytes Absolute: 0.8 10*3/uL (ref 0.1–1.0)
Monocytes Relative: 12 %
Neutro Abs: 3.9 10*3/uL (ref 1.7–7.7)
Neutrophils Relative %: 60 %
Platelets: 178 10*3/uL (ref 150–400)
RBC: 4.08 MIL/uL (ref 3.87–5.11)
RDW: 13.2 % (ref 11.5–15.5)
WBC: 6.4 10*3/uL (ref 4.0–10.5)
nRBC: 0 % (ref 0.0–0.2)

## 2022-04-07 LAB — MAGNESIUM: Magnesium: 2 mg/dL (ref 1.7–2.4)

## 2022-04-07 MED ORDER — SODIUM CHLORIDE 0.9 % IV SOLN
10.0000 mg | Freq: Once | INTRAVENOUS | Status: AC
  Administered 2022-04-07: 10 mg via INTRAVENOUS
  Filled 2022-04-07: qty 1

## 2022-04-07 MED ORDER — PALONOSETRON HCL INJECTION 0.25 MG/5ML
0.2500 mg | Freq: Once | INTRAVENOUS | Status: AC
  Administered 2022-04-07: 0.25 mg via INTRAVENOUS
  Filled 2022-04-07: qty 5

## 2022-04-07 MED ORDER — SODIUM CHLORIDE 0.9 % IV SOLN
540.0000 mg/m2 | Freq: Once | INTRAVENOUS | Status: AC
  Administered 2022-04-07: 900 mg via INTRAVENOUS
  Filled 2022-04-07: qty 45

## 2022-04-07 MED ORDER — SODIUM CHLORIDE 0.9 % IV SOLN
67.5000 mg/m2 | Freq: Once | INTRAVENOUS | Status: AC
  Administered 2022-04-07: 111 mg via INTRAVENOUS
  Filled 2022-04-07: qty 11.1

## 2022-04-07 MED ORDER — SODIUM CHLORIDE 0.9 % IV SOLN: Freq: Once | INTRAVENOUS | Status: AC

## 2022-04-07 MED ORDER — SODIUM CHLORIDE 0.9% FLUSH
10.0000 mL | Freq: Once | INTRAVENOUS | Status: AC
  Administered 2022-04-07: 10 mL via INTRAVENOUS

## 2022-04-07 MED ORDER — SODIUM CHLORIDE 0.9% FLUSH
10.0000 mL | INTRAVENOUS | Status: DC | PRN
  Administered 2022-04-07: 10 mL

## 2022-04-07 MED ORDER — HEPARIN SOD (PORK) LOCK FLUSH 100 UNIT/ML IV SOLN
500.0000 [IU] | Freq: Once | INTRAVENOUS | Status: AC | PRN
  Administered 2022-04-07: 500 [IU]

## 2022-04-07 NOTE — Progress Notes (Signed)
Patient has been examined by Dr. Delton Coombes. Vital signs and labs have been reviewed by MD - ANC, Creatinine, LFTs, hemoglobin, and platelets are within treatment parameters per M.D. - pt may proceed with treatment. Chemo doses increased to 90% full dose per MD. Primary RN and pharmacy notified.

## 2022-04-07 NOTE — Patient Instructions (Signed)
[facility]  Discharge Instructions: Thank you for choosing [facility] to provide your oncology and hematology care.  If you have a lab appointment with the [facility], please come in thru the Main Entrance and check in at the main information desk.  Wear comfortable clothing and clothing appropriate for easy access to any Portacath or PICC line.   We strive to give you quality time with your provider. You may need to reschedule your appointment if you arrive late (15 or more minutes).  Arriving late affects you and other patients whose appointments are after yours.  Also, if you miss three or more appointments without notifying the office, you may be dismissed from the clinic at the provider's discretion.      For prescription refill requests, have your pharmacy contact our office and allow 72 hours for refills to be completed.    Today you received the following chemotherapy and/or immunotherapy agents taxotere, cytoxan    To help prevent nausea and vomiting after your treatment, we encourage you to take your nausea medication as directed.  BELOW ARE SYMPTOMS THAT SHOULD BE REPORTED IMMEDIATELY: *FEVER GREATER THAN 100.4 F (38 C) OR HIGHER *CHILLS OR SWEATING *NAUSEA AND VOMITING THAT IS NOT CONTROLLED WITH YOUR NAUSEA MEDICATION *UNUSUAL SHORTNESS OF BREATH *UNUSUAL BRUISING OR BLEEDING *URINARY PROBLEMS (pain or burning when urinating, or frequent urination) *BOWEL PROBLEMS (unusual diarrhea, constipation, pain near the anus) TENDERNESS IN MOUTH AND THROAT WITH OR WITHOUT PRESENCE OF ULCERS (sore throat, sores in mouth, or a toothache) UNUSUAL RASH, SWELLING OR PAIN  UNUSUAL VAGINAL DISCHARGE OR ITCHING   Items with * indicate a potential emergency and should be followed up as soon as possible or go to the Emergency Department if any problems should occur.  Please show the CHEMOTHERAPY ALERT CARD or IMMUNOTHERAPY ALERT CARD at check-in to the  Emergency Department and triage nurse.  Should you have questions after your visit or need to cancel or reschedule your appointment, please contact [facility] [phone]  and follow the prompts.  Office hours are 8:00 a.m. to 4:30 p.m. Monday - Friday. Please note that voicemails left after 4:00 p.m. may not be returned until the following business day.  We are closed weekends and major holidays. You have access to a nurse at all times for urgent questions. Please call the main number to the clinic [phone] and follow the prompts.  For any non-urgent questions, you may also contact your provider using MyChart. We now offer e-Visits for anyone 98 and older to request care online for non-urgent symptoms. For details visit [email or web address].   Also download the MyChart app! Go to the app store, search "MyChart", open the app, select [facility], and log in with your MyChart username and password.

## 2022-04-07 NOTE — Progress Notes (Signed)
Nutrition Follow-up:  Patient with right breast cancer, estrogen receptor negative. She is receiving docetaxel and cytoxan (first 3/11).   Met with patient during infusion. She reports tolerating first cycle well overall. Patient endorses fatigue/flu-like symptoms lasting 2 days. Patient reports "back spasms" after neulasta injection. She takes zyrtec daily. Patient is eating well. Yesterday, she had sausage biscuit for breakfast, piece of honey toast and protein bar for snack, ham, mac/cheese, pineapple casserole, peas, green beans for lunch, BBQ pork chops, potato salad, lima beans for dinner. Patient notes she has increased gas since starting treatment. She is drinking more water. Patient is taking compazine as needed for nausea. She is taking miralax for chronic constipation.    Medications: reviewed  Labs: reviewed  Anthropometrics: Weight 136 lb 3.2 oz today increased   NUTRITION DIAGNOSIS: Food and nutrition related knowledge deficit improved    INTERVENTION:  Continue strategies for increasing calories and protein with small frequent meals and snacks Discussed strategies for constipation - suggested trying 4 oz glass of prune juice slightly warmed Continue antiemetics as needed for nausea Educated on tips for managing gas - handout provided Informed about virtual classes available (yoga/tai chi) at [facility] - handout provided     MONITORING, EVALUATION, GOAL: weight trends, intake   NEXT VISIT: To be scheduled as needed

## 2022-04-07 NOTE — Progress Notes (Signed)
Labs reviewed , ok to treat today per MD.  Treatment given per orders. Patient tolerated it well without problems. Vitals stable and discharged home from clinic ambulatory. Follow up as scheduled.

## 2022-04-07 NOTE — Patient Instructions (Signed)
[facility] at [facility] Discharge Instructions   You were seen and examined today by Dr. Katragadda.  He reviewed the results of your lab work which are normal/stable.   We will proceed with your treatment today.  Return as scheduled.    Thank you for choosing [facility] at [facility] to provide your oncology and hematology care.  To afford each patient quality time with our provider, please arrive at least 15 minutes before your scheduled appointment time.   If you have a lab appointment with the Cancer Center please come in thru the Main Entrance and check in at the main information desk.  You need to re-schedule your appointment should you arrive 10 or more minutes late.  We strive to give you quality time with our providers, and arriving late affects you and other patients whose appointments are after yours.  Also, if you no show three or more times for appointments you may be dismissed from the clinic at the providers discretion.     Again, thank you for choosing [facility].  Our hope is that these requests will decrease the amount of time that you wait before being seen by our physicians.       _____________________________________________________________  Should you have questions after your visit to [facility], please contact our office at [phone] and follow the prompts.  Our office hours are 8:00 a.m. and 4:30 p.m. Monday - Friday.  Please note that voicemails left after 4:00 p.m. may not be returned until the following business day.  We are closed weekends and major holidays.  You do have access to a nurse 24-7, just call the main number to the clinic [phone] and do not press any options, hold on the line and a nurse will answer the phone.    For prescription refill requests, have your pharmacy contact our office and allow 72 hours.    Due to Covid, you will need to wear a mask upon entering  the hospital. If you do not have a mask, a mask will be given to you at the Main Entrance upon arrival. For doctor visits, patients may have 1 support person age 18 or older with them. For treatment visits, patients can not have anyone with them due to social distancing guidelines and our immunocompromised population.      

## 2022-04-08 ENCOUNTER — Other Ambulatory Visit: Payer: Self-pay

## 2022-04-09 ENCOUNTER — Inpatient Hospital Stay: Payer: Medicare PPO

## 2022-04-09 VITALS — BP 100/57 | HR 84 | Temp 98.3°F | Resp 18

## 2022-04-09 DIAGNOSIS — Z5111 Encounter for antineoplastic chemotherapy: Secondary | ICD-10-CM | POA: Diagnosis not present

## 2022-04-09 DIAGNOSIS — Z79899 Other long term (current) drug therapy: Secondary | ICD-10-CM | POA: Diagnosis not present

## 2022-04-09 DIAGNOSIS — Z171 Estrogen receptor negative status [ER-]: Secondary | ICD-10-CM | POA: Diagnosis not present

## 2022-04-09 DIAGNOSIS — Z5189 Encounter for other specified aftercare: Secondary | ICD-10-CM | POA: Diagnosis not present

## 2022-04-09 DIAGNOSIS — C50411 Malignant neoplasm of upper-outer quadrant of right female breast: Secondary | ICD-10-CM | POA: Diagnosis not present

## 2022-04-09 MED ORDER — PEGFILGRASTIM-CBQV 6 MG/0.6ML ~~LOC~~ SOSY
6.0000 mg | PREFILLED_SYRINGE | Freq: Once | SUBCUTANEOUS | Status: AC
  Administered 2022-04-09: 6 mg via SUBCUTANEOUS
  Filled 2022-04-09: qty 0.6

## 2022-04-09 NOTE — Progress Notes (Signed)
Angel Barry presents today for Udenyca injection per the provider's orders.  Stable during administration without incident; injection site WNL; see MAR for injection details.  Patient tolerated procedure well and without incident.  No questions or complaints noted at this time.

## 2022-04-09 NOTE — Patient Instructions (Signed)
[facility]  Discharge Instructions: Thank you for choosing [facility] to provide your oncology and hematology care.  If you have a lab appointment with the [facility] - please note that after April 8th, 2024, all labs will be drawn in the cancer center.  You do not have to check in or register with the main entrance as you have in the past but will complete your check-in in the cancer center.  Wear comfortable clothing and clothing appropriate for easy access to any Portacath or PICC line.   We strive to give you quality time with your provider. You may need to reschedule your appointment if you arrive late (15 or more minutes).  Arriving late affects you and other patients whose appointments are after yours.  Also, if you miss three or more appointments without notifying the office, you may be dismissed from the clinic at the provider's discretion.      For prescription refill requests, have your pharmacy contact our office and allow 72 hours for refills to be completed.    Today you received the following chemotherapy and/or immunotherapy agents Udenyca      To help prevent nausea and vomiting after your treatment, we encourage you to take your nausea medication as directed.  BELOW ARE SYMPTOMS THAT SHOULD BE REPORTED IMMEDIATELY: *FEVER GREATER THAN 100.4 F (38 C) OR HIGHER *CHILLS OR SWEATING *NAUSEA AND VOMITING THAT IS NOT CONTROLLED WITH YOUR NAUSEA MEDICATION *UNUSUAL SHORTNESS OF BREATH *UNUSUAL BRUISING OR BLEEDING *URINARY PROBLEMS (pain or burning when urinating, or frequent urination) *BOWEL PROBLEMS (unusual diarrhea, constipation, pain near the anus) TENDERNESS IN MOUTH AND THROAT WITH OR WITHOUT PRESENCE OF ULCERS (sore throat, sores in mouth, or a toothache) UNUSUAL RASH, SWELLING OR PAIN  UNUSUAL VAGINAL DISCHARGE OR ITCHING   Items with * indicate a potential emergency and should be followed up as soon as possible or go to the  Emergency Department if any problems should occur.  Please show the CHEMOTHERAPY ALERT CARD or IMMUNOTHERAPY ALERT CARD at check-in to the Emergency Department and triage nurse.  Should you have questions after your visit or need to cancel or reschedule your appointment, please contact [facility] [phone]  and follow the prompts.  Office hours are 8:00 a.m. to 4:30 p.m. Monday - Friday. Please note that voicemails left after 4:00 p.m. may not be returned until the following business day.  We are closed weekends and major holidays. You have access to a nurse at all times for urgent questions. Please call the main number to the clinic [phone] and follow the prompts.  For any non-urgent questions, you may also contact your provider using MyChart. We now offer e-Visits for anyone 71 and older to request care online for non-urgent symptoms. For details visit [email or web address].   Also download the MyChart app! Go to the app store, search "MyChart", open the app, select [facility], and log in with your MyChart username and password.

## 2022-04-14 ENCOUNTER — Other Ambulatory Visit: Payer: Self-pay | Admitting: Genetic Counselor

## 2022-04-14 DIAGNOSIS — C50411 Malignant neoplasm of upper-outer quadrant of right female breast: Secondary | ICD-10-CM

## 2022-04-15 ENCOUNTER — Inpatient Hospital Stay (HOSPITAL_BASED_OUTPATIENT_CLINIC_OR_DEPARTMENT_OTHER): Payer: Medicare PPO | Admitting: Genetic Counselor

## 2022-04-15 ENCOUNTER — Encounter: Payer: Self-pay | Admitting: Genetic Counselor

## 2022-04-15 ENCOUNTER — Inpatient Hospital Stay: Payer: Medicare PPO

## 2022-04-15 DIAGNOSIS — Z806 Family history of leukemia: Secondary | ICD-10-CM | POA: Diagnosis not present

## 2022-04-15 DIAGNOSIS — Z171 Estrogen receptor negative status [ER-]: Secondary | ICD-10-CM | POA: Diagnosis not present

## 2022-04-15 DIAGNOSIS — C50411 Malignant neoplasm of upper-outer quadrant of right female breast: Secondary | ICD-10-CM

## 2022-04-15 DIAGNOSIS — Z801 Family history of malignant neoplasm of trachea, bronchus and lung: Secondary | ICD-10-CM | POA: Diagnosis not present

## 2022-04-15 DIAGNOSIS — Z803 Family history of malignant neoplasm of breast: Secondary | ICD-10-CM | POA: Insufficient documentation

## 2022-04-15 DIAGNOSIS — Z8051 Family history of malignant neoplasm of kidney: Secondary | ICD-10-CM | POA: Diagnosis not present

## 2022-04-15 LAB — GENETIC SCREENING ORDER

## 2022-04-15 NOTE — Progress Notes (Signed)
REFERRING PROVIDER: Doreatha Massed, MD [address] [place],  [place] [address]  PRIMARY PROVIDER:  Donetta Potts, MD  PRIMARY REASON FOR VISIT:  1. Family history of breast cancer   2. Family history of leukemia   3. Family history of kidney cancer   4. Carcinoma of upper-outer quadrant of right breast in female, estrogen receptor negative      HISTORY OF PRESENT ILLNESS:   Ms. Kunzler, a 77 y.o. female, was seen for a [facility] cancer genetics consultation at the request of Dr. [name] Saba due to a personal and family history of cancer.  Ms. Bonvillain presents to clinic today to discuss the possibility of a hereditary predisposition to cancer, genetic testing, and to further clarify her future cancer risks, as well as potential cancer risks for family members.   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 The treatment plan includes chemotherapy.     CANCER HISTORY:  Oncology History  Carcinoma of upper-outer quadrant of right breast in female, estrogen receptor negative  01/23/2022 Initial Diagnosis   Carcinoma of upper-outer quadrant of right breast in female, estrogen receptor negative (HCC)   03/04/2022 Cancer Staging   Staging form: Breast, AJCC 8th Edition - Clinical stage from 03/04/2022: Stage IB (cT1c, cN0, cM0, G3, ER-, PR-, HER2-) - Signed by Doreatha Massed, MD on 03/04/2022 Histopathologic type: Infiltrating duct carcinoma, NOS Stage prefix: Initial diagnosis Nuclear grade: G3 Histologic grading system: 3 grade system   03/17/2022 -  Chemotherapy   Patient is on Treatment Plan : BREAST TC q21d        RISK FACTORS:  Menarche was at age 71.  First live birth at age 86.  OCP use for approximately  3-4  years.  Ovaries intact: yes.  Hysterectomy: no.  Menopausal status: postmenopausal.  HRT use: 0 years. Colonoscopy: yes; normal. Mammogram within the last year: yes. Number of breast biopsies: 1. Up  to date with pelvic exams: yes. Any excessive radiation exposure in the past: no  Past Medical History:  Diagnosis Date   Arthritis    Family history of breast cancer    Family history of kidney cancer    Family history of leukemia    GERD (gastroesophageal reflux disease)    Hypercholesteremia    Hypertension    Pre-diabetes     Past Surgical History:  Procedure Laterality Date   BREAST BIOPSY Right 01/14/2022   [place] RT BREAST BX W LOC DEV 1ST LESION IMG BX SPEC US GUIDE 01/14/2022 Edwin Cap, MD [facility]   CATARACT EXTRACTION W/PHACO Left 12/18/2016   Procedure: CATARACT EXTRACTION PHACO AND INTRAOCULAR LENS PLACEMENT (IOC);  Surgeon: Fabio Pierce, MD;  Location: [facility];  Service: Ophthalmology;  Laterality: Left;  CDE: 9.55   CATARACT EXTRACTION W/PHACO Right 08/06/2018   Procedure: CATARACT EXTRACTION PHACO AND INTRAOCULAR LENS PLACEMENT (IOC);  Surgeon: Fabio Pierce, MD;  Location: [facility];  Service: Ophthalmology;  Laterality: Right;  CDE: 13.46   PARTIAL MASTECTOMY WITH AXILLARY SENTINEL LYMPH NODE BIOPSY Right 02/05/2022   PORTACATH PLACEMENT Left 03/14/2022   Procedure: INSERTION PORT-A-CATH;  Surgeon: Lucretia Roers, MD;  Location: [facility];  Service: General;  Laterality: Left;   TUBAL LIGATION      Social History   Socioeconomic History   Marital status: Married    Spouse name: Not on file   Number of children: Not on file   Years of education: Not on file  Highest education level: Not on file  Occupational History   Not on file  Tobacco Use   Smoking status: Never   Smokeless tobacco: Never  Vaping Use   Vaping Use: Never used  Substance and Sexual Activity   Alcohol use: Yes    Comment: very rarely   Drug use: No   Sexual activity: Yes    Birth control/protection: Post-menopausal  Other Topics Concern   Not on file  Social History Narrative   Not on file   Social Determinants of Health   Financial Resource Strain: Not on file  Food  Insecurity: No Food Insecurity (03/05/2022)   Hunger Vital Sign    Worried About Running Out of Food in the Last Year: Never true    Ran Out of Food in the Last Year: Never true  Transportation Needs: No Transportation Needs (03/05/2022)   PRAPARE - Transportation    Lack of Transportation (Medical): No    Lack of Transportation (Non-Medical): No  Physical Activity: Not on file  Stress: Not on file  Social Connections: Not on file     FAMILY HISTORY:  We obtained a detailed, 4-generation family history.  Significant diagnoses are listed below: Family History  Problem Relation Age of Onset   Atrial fibrillation Mother    Leukemia Mother 7       AML   Irregular heart beat Father    Leukemia Maternal Aunt 44       AML   Leukemia Maternal Uncle 35       CLL   Kidney cancer Paternal Aunt 68   Leukemia Maternal Grandmother 13   Stroke Paternal Grandmother    Alcoholism Paternal Grandfather    Brain cancer Son 31       Glioblastoma   Melanoma Cousin        Maternal first cousin   Breast cancer Cousin        paternal first cousin   Breast cancer Cousin        paternal first cousin's daughter     The patient has two sons and a daughter.  One son was diagnosed with a glioblastoma at 6.  She has a brother and sister who are cancer free.  Both parents are deceased.  The patient's mother was diagnosed with AML at age 28.  She had two brothers and a sister.  One brother was diagnosed with CLL and her sister was diagnosed with AML.  The maternal grandmother was diagnosed with leukemia at 86.  The patient's father died in a hunting accident.  He had a sister and brother.  The sister had kidney cancer at age 46 and she has a daughter who had breast cancer at 47.  The brother has a granddaughter who had breast cancer.  Ms. Chandra is unaware of previous family history of genetic testing for hereditary cancer risks. Patient's ancestors are of [place] and [place]  descent. There is no reported Ashkenazi Jewish ancestry. There is no known consanguinity.  GENETIC COUNSELING ASSESSMENT: Ms. Gohr is a 77 y.o. female with a personal and family history of breast cancer which is somewhat suggestive of a hereditary cancer syndrome and predisposition to cancer given her triple negative status and the young age of onset of her cousin. We, therefore, discussed and recommended the following at today's visit.   DISCUSSION: We discussed that, in general, most cancer is not inherited in families, but instead is sporadic or familial. Sporadic cancers occur by chance and typically  happen at older ages (>50 years) as this type of cancer is caused by genetic changes acquired during an individual's lifetime. Some families have more cancers than would be expected by chance; however, the ages or types of cancer are not consistent with a known genetic mutation or known genetic mutations have been ruled out. This type of familial cancer is thought to be due to a combination of multiple genetic, environmental, hormonal, and lifestyle factors. While this combination of factors likely increases the risk of cancer, the exact source of this risk is not currently identifiable or testable.  We discussed that 5 - 10% of breast cancer is hereditary, with most cases associated with BRCA mutations.  There are other genes that can be associated with hereditary breast cancer syndromes.  These include ATM, CHEK2 and PALB2.  The most common form of hereditary TN breast cancer is due to BRCA and PALB2 mutations.  We discussed that testing is beneficial for several reasons including knowing how to follow individuals after completing their treatment, identifying whether potential treatment options such as PARP inhibitors would be beneficial, and understand if other family members could be at risk for cancer and allow them to undergo genetic testing.   We discussed that about 5-10% of leukemias are  hereditary.  Based on her family history of older ages of onset, it could be due to a familial risk or possibly a hereditary risk.  We can add leukemia genes to see if there is a hereditary syndrome that we can identify.  We reviewed the characteristics, features and inheritance patterns of hereditary cancer syndromes. We also discussed genetic testing, including the appropriate family members to test, the process of testing, insurance coverage and turn-around-time for results. We discussed the implications of a negative, positive, carrier and/or variant of uncertain significant result. Ms. Reber  was offered a common hereditary cancer panel (47 genes) and an expanded pan-cancer panel (77 genes). Ms. Junio was informed of the benefits and limitations of each panel, including that expanded pan-cancer panels contain genes that do not have clear management guidelines at this point in time.  We also discussed that as the number of genes included on a panel increases, the chances of variants of uncertain significance increases. Ms. Casco decided to pursue genetic testing for the Multi cancer+Leukemia gene panel.   Based on Ms. Linhart's personal and family history of cancer, she meets medical criteria for genetic testing. Despite that she meets criteria, she may still have an out of pocket cost. We discussed that if her out of pocket cost for testing is over $100, the laboratory will call and confirm whether she wants to proceed with testing.  If the out of pocket cost of testing is less than $100 she will be billed by the genetic testing laboratory.   PLAN: After considering the risks, benefits, and limitations, Ms. Wallner provided informed consent to pursue genetic testing and the blood sample was sent to [facility] for analysis of the Multi-cancer+Leukemia panel. Results should be available within approximately 2-3 weeks' time, at which point they will be disclosed by telephone to Ms. Schuler, as will  any additional recommendations warranted by these results. Ms. Amara will receive a summary of her genetic counseling visit and a copy of her results once available. This information will also be available in Epic.   Lastly, we encouraged Ms. Ouk to remain in contact with cancer genetics annually so that we can continuously update the family history and inform her of any  changes in cancer genetics and testing that may be of benefit for this family.   Ms. Bethena MidgetKnight's questions were answered to her satisfaction today. Our contact information was provided should additional questions or concerns arise. Thank you for the referral and allowing us to share in the care of your patient.   Colena Ketterman P. Lowell GuitarPowell, MS, [facility] Licensed, Patent attorneyCertified Genetic Counselor [email or web address] phone: [phone]  The patient was seen for a total of 40 minutes in face-to-face genetic counseling.  The patient brought her husband. Drs. Meliton RattanFeng, Iruku, and/or NewtonGudena were available for questions, if needed..    _______________________________________________________________________ For Office Staff:  Number of people involved in session: 2 Was an Intern/ student involved with case: no

## 2022-04-22 ENCOUNTER — Encounter: Payer: Self-pay | Admitting: Genetic Counselor

## 2022-04-22 DIAGNOSIS — Z1379 Encounter for other screening for genetic and chromosomal anomalies: Secondary | ICD-10-CM | POA: Insufficient documentation

## 2022-04-23 ENCOUNTER — Ambulatory Visit: Payer: Self-pay | Admitting: Genetic Counselor

## 2022-04-23 ENCOUNTER — Telehealth: Payer: Self-pay | Admitting: Genetic Counselor

## 2022-04-23 DIAGNOSIS — Z1379 Encounter for other screening for genetic and chromosomal anomalies: Secondary | ICD-10-CM

## 2022-04-23 NOTE — Telephone Encounter (Signed)
Revealed negative genetic testing.  Discussed that we do not know why she has breast cancer or why there is cancer in the family. It could be due to a different gene that we are not testing, or maybe our current technology may not be able to pick something up.  It will be important for her to keep in contact with genetics to keep up with whether additional testing may be needed. 

## 2022-04-23 NOTE — Progress Notes (Signed)
HPI:  Ms. Jenkin was previously seen in the [facility] due to a personal and family history of cancer and concerns regarding a hereditary predisposition to cancer. Please refer to our prior cancer genetics clinic note for more information regarding our discussion, assessment and recommendations, at the time. Ms. Kendrick recent genetic test results were disclosed to her, as were recommendations warranted by these results. These results and recommendations are discussed in more detail below.  CANCER HISTORY:  Oncology History  Carcinoma of upper-outer quadrant of right breast in female, estrogen receptor negative  01/23/2022 Initial Diagnosis   Carcinoma of upper-outer quadrant of right breast in female, estrogen receptor negative (HCC)   03/04/2022 Cancer Staging   Staging form: Breast, AJCC 8th Edition - Clinical stage from 03/04/2022: Stage IB (cT1c, cN0, cM0, G3, ER-, PR-, HER2-) - Signed by Doreatha Massed, MD on 03/04/2022 Histopathologic type: Infiltrating duct carcinoma, NOS Stage prefix: Initial diagnosis Nuclear grade: G3 Histologic grading system: 3 grade system   03/17/2022 -  Chemotherapy   Patient is on Treatment Plan : BREAST TC q21d     04/21/2022 Genetic Testing   Negative genetic testing on the Custom Cancer Panel.  The report date is April 21, 2022.  The Custom-Cancer offered by [facility] includes sequencing and/or deletion/duplication analysis of the following 125 genes:  ADA, AIP, ALK, ANKRD26*, APC*, ATM*, AXIN2, BAP1, BARD1, BLM, BMPR1A, BRCA1, BRCA2, BRIP1, CARD11, CARMIL2, CASP8, CBL, CD27, CDC73, CDH1, CDK4, CDKN1B, CDKN2A (p14ARF), CDKN2A (p16INK4a), CEBPA, CHEK2, CTLA4, CTNNA1, CTPS1, DDX41, DICER1*, DOCK8, EGFR, ELANE, EPCAM*, ERCC6L2, ETV6, FADD, FAS, FASLG, FCHO1, FH*, FLCN, G6PC3, GATA2, GFI1*, GREM1*, HAX1, HOXB13, IKZF1, IL2RA, IL2RB, ITK, KIT, KRAS, LZTR1, MAGT1, MAX*, MBD4, MCM4, MECOM, MEN1*, MET*, MITF, MLH1*, MSH2*, MSH3*, MSH6*, MUTYH,  NBN, NF1*, NF2, NTHL1, PALB2, PDGFRA, PIK3CD, PIK3R1, PMS2*, POLD1*, POLE, POT1, PRKAR1A, PRKCD, PTCH1, PTEN*, PTPN11, RAC2, RAD51C, RAD51D, RASGRP1, RB1*, RET, RHOH, RMRP, RTEL1, RUNX1, SAMD9, SAMD9L, SDHA*, SDHAF2, SDHB, SDHC*, SDHD, SH2D1A, SMAD4, SMARCA4, SMARCB1, SMARCE1, SRP72, STAT3, STK11, STK4, STXBP2, SUFU, TERC, TERT, TMEM127, TNFRSF13B, TP53, TPP2, TSC1*, TSC2, VHL, WAS, and XIAP.      FAMILY HISTORY:  We obtained a detailed, 4-generation family history.  Significant diagnoses are listed below: Family History  Problem Relation Age of Onset   Atrial fibrillation Mother    Leukemia Mother 68       AML   Irregular heart beat Father    Leukemia Maternal Aunt 100       AML   Leukemia Maternal Uncle 67       CLL   Kidney cancer Paternal Aunt 26   Leukemia Maternal Grandmother 2   Stroke Paternal Grandmother    Alcoholism Paternal Grandfather    Brain cancer Son 29       Glioblastoma   Melanoma Cousin        Maternal first cousin   Breast cancer Cousin        paternal first cousin   Breast cancer Cousin        paternal first cousin's daughter       The patient has two sons and a daughter.  One son was diagnosed with a glioblastoma at 82.  She has a brother and sister who are cancer free.  Both parents are deceased.   The patient's mother was diagnosed with AML at age 30.  She had two brothers and a sister.  One brother was diagnosed with CLL and her sister was diagnosed with AML.  The maternal  grandmother was diagnosed with leukemia at 48.   The patient's father died in a hunting accident.  He had a sister and brother.  The sister had kidney cancer at age 58 and she has a daughter who had breast cancer at 60.  The brother has a granddaughter who had breast cancer.   Ms. Nakada is unaware of previous family history of genetic testing for hereditary cancer risks. Patient's ancestors are of [place] and [place] descent. There is no reported Ashkenazi  Jewish ancestry. There is no known consanguinity  GENETIC TEST RESULTS: Genetic testing reported out on April 21, 2022 through the Custom Cancer + Leukemia cancer panel found no pathogenic mutations. The Custom-Cancer offered by [facility] includes sequencing and/or deletion/duplication analysis of the following 125 genes:  ADA, AIP, ALK, ANKRD26*, APC*, ATM*, AXIN2, BAP1, BARD1, BLM, BMPR1A, BRCA1, BRCA2, BRIP1, CARD11, CARMIL2, CASP8, CBL, CD27, CDC73, CDH1, CDK4, CDKN1B, CDKN2A (p14ARF), CDKN2A (p16INK4a), CEBPA, CHEK2, CTLA4, CTNNA1, CTPS1, DDX41, DICER1*, DOCK8, EGFR, ELANE, EPCAM*, ERCC6L2, ETV6, FADD, FAS, FASLG, FCHO1, FH*, FLCN, G6PC3, GATA2, GFI1*, GREM1*, HAX1, HOXB13, IKZF1, IL2RA, IL2RB, ITK, KIT, KRAS, LZTR1, MAGT1, MAX*, MBD4, MCM4, MECOM, MEN1*, MET*, MITF, MLH1*, MSH2*, MSH3*, MSH6*, MUTYH, NBN, NF1*, NF2, NTHL1, PALB2, PDGFRA, PIK3CD, PIK3R1, PMS2*, POLD1*, POLE, POT1, PRKAR1A, PRKCD, PTCH1, PTEN*, PTPN11, RAC2, RAD51C, RAD51D, RASGRP1, RB1*, RET, RHOH, RMRP, RTEL1, RUNX1, SAMD9, SAMD9L, SDHA*, SDHAF2, SDHB, SDHC*, SDHD, SH2D1A, SMAD4, SMARCA4, SMARCB1, SMARCE1, SRP72, STAT3, STK11, STK4, STXBP2, SUFU, TERC, TERT, TMEM127, TNFRSF13B, TP53, TPP2, TSC1*, TSC2, VHL, WAS, and XIAP.  The test report has been scanned into EPIC and is located under the Molecular Pathology section of the Results Review tab.  A portion of the result report is included below for reference.     We discussed with Ms. Loiselle that because current genetic testing is not perfect, it is possible there may be a gene mutation in one of these genes that current testing cannot detect, but that chance is small.  We also discussed, that there could be another gene that has not yet been discovered, or that we have not yet tested, that is responsible for the cancer diagnoses in the family. It is also possible there is a hereditary cause for the cancer in the family that Ms. Blew did not inherit and therefore was not identified in her  testing.  Therefore, it is important to remain in touch with cancer genetics in the future so that we can continue to offer Ms. Ohle the most up to date genetic testing.   ADDITIONAL GENETIC TESTING: We discussed with Ms. Goldmann that her genetic testing was fairly extensive.  If there are genes identified to increase cancer risk that can be analyzed in the future, we would be happy to discuss and coordinate this testing at that time.    CANCER SCREENING RECOMMENDATIONS: Ms. Camillo test result is considered negative (normal).  This means that we have not identified a hereditary cause for her personal and family history of cancer at this time. Most cancers happen by chance and this negative test suggests that her cancer may fall into this category.    While reassuring, this does not definitively rule out a hereditary predisposition to cancer. It is still possible that there could be genetic mutations that are undetectable by current technology. There could be genetic mutations in genes that have not been tested or identified to increase cancer risk.  Therefore, it is recommended she continue to follow the cancer management and screening guidelines provided  by her oncology and primary healthcare provider.   An individual's cancer risk and medical management are not determined by genetic test results alone. Overall cancer risk assessment incorporates additional factors, including personal medical history, family history, and any available genetic information that may result in a personalized plan for cancer prevention and surveillance  RECOMMENDATIONS FOR FAMILY MEMBERS:  Individuals in this family might be at some increased risk of developing cancer, over the general population risk, simply due to the family history of cancer.  We recommended women in this family have a yearly mammogram beginning at age 45, or 3 years younger than the earliest onset of cancer, an annual clinical breast exam, and perform  monthly breast self-exams. Women in this family should also have a gynecological exam as recommended by their primary provider. All family members should be referred for colonoscopy starting at age 59.  It is also possible there is a hereditary cause for the cancer in Ms. Rosato's family that she did not inherit and therefore was not identified in her.  Based on Ms. Baiz's family history, we recommended her siblings consider genetic testing for hereditary leukemia syndromes, based on the family history. Ms. Coppess will let us know if we can be of any assistance in coordinating genetic counseling and/or testing for this family member.   FOLLOW-UP: Lastly, we discussed with Ms. Needle that cancer genetics is a rapidly advancing field and it is possible that new genetic tests will be appropriate for her and/or her family members in the future. We encouraged her to remain in contact with cancer genetics on an annual basis so we can update her personal and family histories and let her know of advances in cancer genetics that may benefit this family.   Our contact number was provided. Ms. Bing questions were answered to her satisfaction, and she knows she is welcome to call us at anytime with additional questions or concerns.   Maylon Cos, MS, [facility] Licensed, Certified Genetic Counselor [email or web address]

## 2022-04-27 NOTE — Progress Notes (Signed)
[facility] [address] [place] [address]    Clinic Day:  04/28/2022  Referring physician: Donetta Potts, MD  Patient Care Team: Donetta Potts, MD as PCP - General (Internal Medicine) Doreatha Massed, MD as Medical Oncologist (Medical Oncology) Therese Sarah, RN as Oncology Nurse Navigator (Medical Oncology)   ASSESSMENT & PLAN:   Assessment: 1.  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  Social/family history: - Lives at home with her husband.  Retired Runner, broadcasting/film/video.  Non-smoker. - Mother died of AML at age 24.  Maternal aunt had AML.  Maternal grandmother had leukemia, unknown type.  Maternal uncle had chronic leukemia.  Paternal aunt had bladder cancer.  Paternal first cousin had breast cancer.  Her son was diagnosed with GBM at age 29.   Plan: 1.  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 - She felt well for the first week with generalized aching and chills and fatigue. - Denies any tingling or numbness in extremities. - We reviewed germline mutation testing results which were negative. - Labs today: Normal LFTs and creatinine.  CBC was grossly normal. - Proceed with cycle 3 today.  RTC 3 weeks for cycle 4. - Will consider radiation oncology referral at next visit.  No orders of the defined types were  placed in this encounter.     I,Katie Daubenspeck,acting as a Neurosurgeon for Doreatha Massed, MD.,have documented all relevant documentation on the behalf of Doreatha Massed, MD,as directed by  Doreatha Massed, MD while in the presence of Doreatha Massed, MD.   I, Doreatha Massed MD, have reviewed the above documentation for accuracy and completeness, and I agree with the above.   Doreatha Massed, MD   4/22/202411:55 AM  CHIEF COMPLAINT:   Diagnosis: Triple negative right breast cancer    Cancer Staging  Carcinoma of upper-outer quadrant of right breast in female, estrogen receptor negative Staging form: Breast, AJCC 8th Edition - Clinical stage from 03/04/2022: Stage IB (cT1c, cN0, cM0, G3, ER-, PR-, HER2-) - Signed by Doreatha Massed, MD on 03/04/2022    Prior Therapy: right lumpectomy and SLNB on 02/05/2022   Current Therapy:  Docetaxel and cyclophosphamide    HISTORY OF PRESENT ILLNESS:   Oncology History  Carcinoma of upper-outer quadrant of right breast in female, estrogen receptor negative  01/23/2022 Initial Diagnosis   Carcinoma of upper-outer quadrant of right breast in female, estrogen receptor negative (HCC)   03/04/2022 Cancer Staging   Staging form: Breast, AJCC 8th Edition - Clinical stage from 03/04/2022: Stage IB (cT1c, cN0, cM0, G3, ER-, PR-, HER2-) - Signed by Doreatha Massed, MD on 03/04/2022 Histopathologic type: Infiltrating duct carcinoma, NOS Stage prefix: Initial diagnosis Nuclear grade: G3 Histologic grading system: 3 grade system   03/17/2022 -  Chemotherapy   Patient is on Treatment Plan : BREAST TC q21d  04/21/2022 Genetic Testing   Negative genetic testing on the Custom Cancer Panel.  The report date is April 21, 2022.  The Custom-Cancer offered by [facility] includes sequencing and/or deletion/duplication analysis of the following 125 genes:  ADA, AIP, ALK, ANKRD26*, APC*, ATM*, AXIN2, BAP1, BARD1, BLM, BMPR1A,  BRCA1, BRCA2, BRIP1, CARD11, CARMIL2, CASP8, CBL, CD27, CDC73, CDH1, CDK4, CDKN1B, CDKN2A (p14ARF), CDKN2A (p16INK4a), CEBPA, CHEK2, CTLA4, CTNNA1, CTPS1, DDX41, DICER1*, DOCK8, EGFR, ELANE, EPCAM*, ERCC6L2, ETV6, FADD, FAS, FASLG, FCHO1, FH*, FLCN, G6PC3, GATA2, GFI1*, GREM1*, HAX1, HOXB13, IKZF1, IL2RA, IL2RB, ITK, KIT, KRAS, LZTR1, MAGT1, MAX*, MBD4, MCM4, MECOM, MEN1*, MET*, MITF, MLH1*, MSH2*, MSH3*, MSH6*, MUTYH, NBN, NF1*, NF2, NTHL1, PALB2, PDGFRA, PIK3CD, PIK3R1, PMS2*, POLD1*, POLE, POT1, PRKAR1A, PRKCD, PTCH1, PTEN*, PTPN11, RAC2, RAD51C, RAD51D, RASGRP1, RB1*, RET, RHOH, RMRP, RTEL1, RUNX1, SAMD9, SAMD9L, SDHA*, SDHAF2, SDHB, SDHC*, SDHD, SH2D1A, SMAD4, SMARCA4, SMARCB1, SMARCE1, SRP72, STAT3, STK11, STK4, STXBP2, SUFU, TERC, TERT, TMEM127, TNFRSF13B, TP53, TPP2, TSC1*, TSC2, VHL, WAS, and XIAP.       INTERVAL HISTORY:   Lycia is a 77 y.o. female presenting to clinic today for follow up of triple negative right breast cancer. She was last seen by me on 04/07/22.  Since her last visit, she underwent genetic testing on 04/15/22. Results were negative.  Today, she states that she is doing well overall. Her appetite level is at 100%. Her energy level is at 80%.  PAST MEDICAL HISTORY:   Past Medical History: Past Medical History:  Diagnosis Date   Arthritis    Family history of breast cancer    Family history of kidney cancer    Family history of leukemia    GERD (gastroesophageal reflux disease)    Hypercholesteremia    Hypertension    Pre-diabetes     Surgical History: Past Surgical History:  Procedure Laterality Date   BREAST BIOPSY Right 01/14/2022   [place] RT BREAST BX W LOC DEV 1ST LESION IMG BX SPEC US GUIDE 01/14/2022 Edwin Cap, MD [facility]   CATARACT EXTRACTION W/PHACO Left 12/18/2016   Procedure: CATARACT EXTRACTION PHACO AND INTRAOCULAR LENS PLACEMENT (IOC);  Surgeon: Fabio Pierce, MD;  Location: [facility];  Service: Ophthalmology;  Laterality: Left;  CDE: 9.55    CATARACT EXTRACTION W/PHACO Right 08/06/2018   Procedure: CATARACT EXTRACTION PHACO AND INTRAOCULAR LENS PLACEMENT (IOC);  Surgeon: Fabio Pierce, MD;  Location: [facility];  Service: Ophthalmology;  Laterality: Right;  CDE: 13.46   PARTIAL MASTECTOMY WITH AXILLARY SENTINEL LYMPH NODE BIOPSY Right 02/05/2022   PORTACATH PLACEMENT Left 03/14/2022   Procedure: INSERTION PORT-A-CATH;  Surgeon: Lucretia Roers, MD;  Location: [facility];  Service: General;  Laterality: Left;   TUBAL LIGATION      Social History: Social History   Socioeconomic History   Marital status: Married    Spouse name: Not on file   Number of children: Not on file   Years of education: Not on file   Highest education level: Not on file  Occupational History   Not on file  Tobacco Use   Smoking status: Never   Smokeless tobacco: Never  Vaping Use   Vaping Use: Never used  Substance and Sexual Activity   Alcohol use: Yes    Comment: very rarely   Drug use: No   Sexual activity: Yes    Birth control/protection: Post-menopausal  Other Topics Concern   Not on file  Social History Narrative   Not on file   Social Determinants of Health   Financial Resource Strain: Not on  file  Food Insecurity: No Food Insecurity (03/05/2022)   Hunger Vital Sign    Worried About Running Out of Food in the Last Year: Never true    Ran Out of Food in the Last Year: Never true  Transportation Needs: No Transportation Needs (03/05/2022)   PRAPARE - Administrator, Civil Service (Medical): No    Lack of Transportation (Non-Medical): No  Physical Activity: Not on file  Stress: Not on file  Social Connections: Not on file  Intimate Partner Violence: Not At Risk (03/05/2022)   Humiliation, Afraid, Rape, and Kick questionnaire    Fear of Current or Ex-Partner: No    Emotionally Abused: No    Physically Abused: No    Sexually Abused: No    Family History: Family History  Problem Relation Age of Onset   Atrial  fibrillation Mother    Leukemia Mother 4       AML   Irregular heart beat Father    Leukemia Maternal Aunt 80       AML   Leukemia Maternal Uncle 80       CLL   Kidney cancer Paternal Aunt 70   Leukemia Maternal Grandmother 61   Stroke Paternal Grandmother    Alcoholism Paternal Grandfather    Brain cancer Son 25       Glioblastoma   Melanoma Cousin        Maternal first cousin   Breast cancer Cousin        paternal first cousin   Breast cancer Cousin        paternal first cousin's daughter    Current Medications:  Current Outpatient Medications:    acetaminophen (TYLENOL) 500 MG tablet, Take 500 mg by mouth every 6 (six) hours as needed for moderate pain., Disp: , Rfl:    ascorbic acid (VITAMIN C) 500 MG tablet, Take 500 mg by mouth in the morning., Disp: , Rfl:    cetirizine (ZYRTEC) 10 MG tablet, Take 10 mg by mouth daily as needed for allergies., Disp: , Rfl:    Cholecalciferol (VITAMIN D) 50 MCG (2000 UT) tablet, Take 2,000 Units by mouth in the morning., Disp: , Rfl:    CYCLOPHOSPHAMIDE IV, Inject into the vein every 21 ( twenty-one) days., Disp: , Rfl:    DOCETAXEL IV, Inject into the vein every 21 ( twenty-one) days., Disp: , Rfl:    ELDERBERRY PO, Take 10 mLs by mouth in the morning., Disp: , Rfl:    fluticasone (FLONASE) 50 MCG/ACT nasal spray, Place 2 sprays into both nostrils in the morning., Disp: , Rfl:    fosinopril-hydrochlorothiazide (MONOPRIL-HCT) 20-12.5 MG tablet, Take 1 tablet by mouth at bedtime., Disp: , Rfl:    lidocaine-prilocaine (EMLA) cream, Apply 1 Application topically as needed. Apply a quarter-sized amount to port a cath site and cover with plastic wrap one hour prior to infusion appointments, Disp: 30 g, Rfl: 3   pantoprazole (PROTONIX) 40 MG tablet, Take 40 mg by mouth daily before breakfast., Disp: , Rfl:    polyethylene glycol (MIRALAX / GLYCOLAX) 17 g packet, Take 17 g by mouth at bedtime., Disp: , Rfl:    Polyethylene Glycol 400 (BLINK TEARS  OP), Place 2 drops into both eyes daily as needed (dry/irritated eyes.)., Disp: , Rfl:    prochlorperazine (COMPAZINE) 10 MG tablet, Take 1 tablet (10 mg total) by mouth every 6 (six) hours as needed for nausea or vomiting., Disp: 60 tablet, Rfl: 3   simvastatin (ZOCOR) 10  MG tablet, Take 10 mg by mouth every evening., Disp: , Rfl:    Zinc 50 MG TABS, Take 50 mg by mouth in the morning., Disp: , Rfl:  No current facility-administered medications for this visit.  Facility-Administered Medications Ordered in Other Visits:    cyclophosphamide (CYTOXAN) 900 mg in sodium chloride 0.9 % 250 mL chemo infusion, 540 mg/m2 (Treatment Plan Recorded), Intravenous, Once, Doreatha Massed, MD   DOCEtaxel (TAXOTERE) 111 mg in sodium chloride 0.9 % 250 mL chemo infusion, 67.5 mg/m2 (Treatment Plan Recorded), Intravenous, Once, Doreatha Massed, MD, Last Rate: 261 mL/hr at 04/28/22 1126, 111 mg at 04/28/22 1126   heparin lock flush 100 unit/mL, 500 Units, Intracatheter, Once PRN, Doreatha Massed, MD   sodium chloride flush (NS) 0.9 % injection 10 mL, 10 mL, Intracatheter, PRN, Doreatha Massed, MD   Allergies: Allergies  Allergen Reactions   Macrobid [Nitrofurantoin Macrocrystal] Nausea And Vomiting    REVIEW OF SYSTEMS:   Review of Systems  Constitutional:  Negative for chills, fatigue and fever.  HENT:   Negative for lump/mass, mouth sores, nosebleeds, sore throat and trouble swallowing.   Eyes:  Negative for eye problems.  Respiratory:  Negative for cough and shortness of breath.   Cardiovascular:  Negative for chest pain, leg swelling and palpitations.  Gastrointestinal:  Negative for abdominal pain, constipation, diarrhea, nausea and vomiting.  Genitourinary:  Positive for frequency. Negative for bladder incontinence, difficulty urinating, dysuria, hematuria and nocturia.   Musculoskeletal:  Negative for arthralgias, back pain, flank pain, myalgias and neck pain.  Skin:  Negative  for itching and rash.  Neurological:  Negative for dizziness, headaches and numbness.  Hematological:  Does not bruise/bleed easily.  Psychiatric/Behavioral:  Negative for depression, sleep disturbance and suicidal ideas. The patient is not nervous/anxious.   All other systems reviewed and are negative.    VITALS:   There were no vitals taken for this visit.  Wt Readings from Last 3 Encounters:  04/28/22 136 lb (61.7 kg)  04/07/22 136 lb 3.2 oz (61.8 kg)  03/24/22 135 lb (61.2 kg)    There is no height or weight on file to calculate BMI.  Performance status (ECOG): 1 - Symptomatic but completely ambulatory  PHYSICAL EXAM:   Physical Exam Vitals and nursing note reviewed. Exam conducted with a chaperone present.  Constitutional:      Appearance: Normal appearance.  Cardiovascular:     Rate and Rhythm: Normal rate and regular rhythm.     Pulses: Normal pulses.     Heart sounds: Normal heart sounds.  Pulmonary:     Effort: Pulmonary effort is normal.     Breath sounds: Normal breath sounds.  Abdominal:     Palpations: Abdomen is soft. There is no hepatomegaly, splenomegaly or mass.     Tenderness: There is no abdominal tenderness.  Musculoskeletal:     Right lower leg: No edema.     Left lower leg: No edema.  Lymphadenopathy:     Cervical: No cervical adenopathy.     Right cervical: No superficial, deep or posterior cervical adenopathy.    Left cervical: No superficial, deep or posterior cervical adenopathy.     Upper Body:     Right upper body: No supraclavicular or axillary adenopathy.     Left upper body: No supraclavicular or axillary adenopathy.  Neurological:     General: No focal deficit present.     Mental Status: She is alert and oriented to person, place, and time.  Psychiatric:  Mood and Affect: Mood normal.        Behavior: Behavior normal.     LABS:      Latest Ref Rng & Units 04/28/2022    9:01 AM 04/07/2022    8:49 AM 03/24/2022   10:51 AM   CBC  WBC 4.0 - 10.5 K/uL 5.9  6.4  4.4   Hemoglobin 12.0 - 15.0 g/dL 16.6  06.3  01.6   Hematocrit 36.0 - 46.0 % 36.0  37.8  36.2   Platelets 150 - 400 K/uL 150  178  135       Latest Ref Rng & Units 04/28/2022    9:01 AM 04/07/2022    8:49 AM 03/24/2022   10:51 AM  CMP  Glucose 70 - 99 mg/dL 010  932  355   BUN 8 - 23 mg/dL 15  18  9    Creatinine 0.44 - 1.00 mg/dL 7.32  2.02  5.42   Sodium 135 - 145 mmol/L 138  137  137   Potassium 3.5 - 5.1 mmol/L 3.7  3.9  3.5   Chloride 98 - 111 mmol/L 104  104  104   CO2 22 - 32 mmol/L 23  23  26    Calcium 8.9 - 10.3 mg/dL 8.9  8.8  8.5   Total Protein 6.5 - 8.1 g/dL 6.7  7.1  6.8   Total Bilirubin 0.3 - 1.2 mg/dL 0.6  0.7  0.5   Alkaline Phos 38 - 126 U/L 45  46  53   AST 15 - 41 U/L 16  18  21    ALT 0 - 44 U/L 14  16  22       No results found for: "CEA1", "CEA" / No results found for: "CEA1", "CEA" No results found for: "PSA1" No results found for: "HCW237" No results found for: "CAN125"  No results found for: "TOTALPROTELP", "ALBUMINELP", "A1GS", "A2GS", "BETS", "BETA2SER", "GAMS", "MSPIKE", "SPEI" No results found for: "TIBC", "FERRITIN", "IRONPCTSAT" No results found for: "LDH"   STUDIES:   No results found.

## 2022-04-28 ENCOUNTER — Inpatient Hospital Stay: Payer: Medicare PPO

## 2022-04-28 ENCOUNTER — Inpatient Hospital Stay (HOSPITAL_BASED_OUTPATIENT_CLINIC_OR_DEPARTMENT_OTHER): Payer: Medicare PPO | Admitting: Hematology

## 2022-04-28 VITALS — BP 118/66 | HR 88 | Temp 97.6°F | Resp 18 | Ht 62.0 in | Wt 136.0 lb

## 2022-04-28 VITALS — BP 115/67 | HR 70 | Temp 97.9°F | Resp 18

## 2022-04-28 DIAGNOSIS — Z79899 Other long term (current) drug therapy: Secondary | ICD-10-CM | POA: Diagnosis not present

## 2022-04-28 DIAGNOSIS — C50411 Malignant neoplasm of upper-outer quadrant of right female breast: Secondary | ICD-10-CM | POA: Diagnosis not present

## 2022-04-28 DIAGNOSIS — Z95828 Presence of other vascular implants and grafts: Secondary | ICD-10-CM

## 2022-04-28 DIAGNOSIS — Z5189 Encounter for other specified aftercare: Secondary | ICD-10-CM | POA: Diagnosis not present

## 2022-04-28 DIAGNOSIS — Z171 Estrogen receptor negative status [ER-]: Secondary | ICD-10-CM

## 2022-04-28 DIAGNOSIS — Z5111 Encounter for antineoplastic chemotherapy: Secondary | ICD-10-CM | POA: Diagnosis not present

## 2022-04-28 LAB — CBC WITH DIFFERENTIAL/PLATELET
Abs Immature Granulocytes: 0.02 10*3/uL (ref 0.00–0.07)
Basophils Absolute: 0.1 10*3/uL (ref 0.0–0.1)
Basophils Relative: 2 %
Eosinophils Absolute: 0 10*3/uL (ref 0.0–0.5)
Eosinophils Relative: 0 %
HCT: 36 % (ref 36.0–46.0)
Hemoglobin: 12.2 g/dL (ref 12.0–15.0)
Immature Granulocytes: 0 %
Lymphocytes Relative: 18 %
Lymphs Abs: 1.1 10*3/uL (ref 0.7–4.0)
MCH: 32.1 pg (ref 26.0–34.0)
MCHC: 33.9 g/dL (ref 30.0–36.0)
MCV: 94.7 fL (ref 80.0–100.0)
Monocytes Absolute: 0.8 10*3/uL (ref 0.1–1.0)
Monocytes Relative: 14 %
Neutro Abs: 4 10*3/uL (ref 1.7–7.7)
Neutrophils Relative %: 66 %
Platelets: 150 10*3/uL (ref 150–400)
RBC: 3.8 MIL/uL — ABNORMAL LOW (ref 3.87–5.11)
RDW: 14.4 % (ref 11.5–15.5)
WBC: 5.9 10*3/uL (ref 4.0–10.5)
nRBC: 0 % (ref 0.0–0.2)

## 2022-04-28 LAB — COMPREHENSIVE METABOLIC PANEL
ALT: 14 U/L (ref 0–44)
AST: 16 U/L (ref 15–41)
Albumin: 3.9 g/dL (ref 3.5–5.0)
Alkaline Phosphatase: 45 U/L (ref 38–126)
Anion gap: 11 (ref 5–15)
BUN: 15 mg/dL (ref 8–23)
CO2: 23 mmol/L (ref 22–32)
Calcium: 8.9 mg/dL (ref 8.9–10.3)
Chloride: 104 mmol/L (ref 98–111)
Creatinine, Ser: 0.74 mg/dL (ref 0.44–1.00)
GFR, Estimated: >60 mL/min (ref >60)
Glucose, Bld: 119 mg/dL — ABNORMAL HIGH (ref 70–99)
Potassium: 3.7 mmol/L (ref 3.5–5.1)
Sodium: 138 mmol/L (ref 135–145)
Total Bilirubin: 0.6 mg/dL (ref 0.3–1.2)
Total Protein: 6.7 g/dL (ref 6.5–8.1)

## 2022-04-28 LAB — MAGNESIUM: Magnesium: 2 mg/dL (ref 1.7–2.4)

## 2022-04-28 MED ORDER — PALONOSETRON HCL INJECTION 0.25 MG/5ML
0.2500 mg | Freq: Once | INTRAVENOUS | Status: AC
  Administered 2022-04-28: 0.25 mg via INTRAVENOUS
  Filled 2022-04-28: qty 5

## 2022-04-28 MED ORDER — SODIUM CHLORIDE 0.9 % IV SOLN
10.0000 mg | Freq: Once | INTRAVENOUS | Status: AC
  Administered 2022-04-28: 10 mg via INTRAVENOUS
  Filled 2022-04-28: qty 10

## 2022-04-28 MED ORDER — SODIUM CHLORIDE 0.9% FLUSH
10.0000 mL | Freq: Once | INTRAVENOUS | Status: AC
  Administered 2022-04-28: 10 mL via INTRAVENOUS

## 2022-04-28 MED ORDER — SODIUM CHLORIDE 0.9% FLUSH
10.0000 mL | INTRAVENOUS | Status: DC | PRN
  Administered 2022-04-28: 10 mL

## 2022-04-28 MED ORDER — SODIUM CHLORIDE 0.9 % IV SOLN: Freq: Once | INTRAVENOUS | Status: AC

## 2022-04-28 MED ORDER — SODIUM CHLORIDE 0.9 % IV SOLN
540.0000 mg/m2 | Freq: Once | INTRAVENOUS | Status: AC
  Administered 2022-04-28: 900 mg via INTRAVENOUS
  Filled 2022-04-28: qty 45

## 2022-04-28 MED ORDER — SODIUM CHLORIDE 0.9 % IV SOLN
67.5000 mg/m2 | Freq: Once | INTRAVENOUS | Status: AC
  Administered 2022-04-28: 111 mg via INTRAVENOUS
  Filled 2022-04-28: qty 11.1

## 2022-04-28 MED ORDER — HEPARIN SOD (PORK) LOCK FLUSH 100 UNIT/ML IV SOLN
500.0000 [IU] | Freq: Once | INTRAVENOUS | Status: AC | PRN
  Administered 2022-04-28: 500 [IU]

## 2022-04-28 NOTE — Progress Notes (Signed)
Patient has been examined by Dr. Katragadda. Vital signs and labs have been reviewed by MD - ANC, Creatinine, LFTs, hemoglobin, and platelets are within treatment parameters per M.D. - pt may proceed with treatment.  Primary RN and pharmacy notified.  

## 2022-04-28 NOTE — Progress Notes (Signed)
Patient presents today for Docetaxel and Cytoxan infusion. Patient is in satisfactory condition with no new complaints voiced.  Vital signs are stable.  Labs reviewed by Dr. [name] Saba during the office visit and all labs are within treatment parameters.  We will proceed with treatment per MD orders.   Treatment given today per MD orders. Tolerated infusion without adverse affects. Vital signs stable. No complaints at this time. Discharged from clinic ambulatory in stable condition. Alert and oriented x 3. F/U with [facility] as scheduled.

## 2022-04-28 NOTE — Progress Notes (Signed)
Please continue dose of Taxotere and Cytoxan at 90% of full dose.  Doses adjusted to reflect above.  T.O. Dr. Carilyn Goodpasture, PharmD

## 2022-04-28 NOTE — Patient Instructions (Signed)
[facility] Discharge Instructions   You were seen and examined today by Dr. Katragadda.  He reviewed the results of your lab work which are normal/stable.   We will proceed with your treatment today.  Return as scheduled.    Thank you for choosing [facility] at [facility] to provide your oncology and hematology care.  To afford each patient quality time with our provider, please arrive at least 15 minutes before your scheduled appointment time.   If you have a lab appointment with the Cancer Center please come in thru the Main Entrance and check in at the main information desk.  You need to re-schedule your appointment should you arrive 10 or more minutes late.  We strive to give you quality time with our providers, and arriving late affects you and other patients whose appointments are after yours.  Also, if you no show three or more times for appointments you may be dismissed from the clinic at the providers discretion.     Again, thank you for choosing [facility].  Our hope is that these requests will decrease the amount of time that you wait before being seen by our physicians.       _____________________________________________________________  Should you have questions after your visit to [facility], please contact our office at [phone] and follow the prompts.  Our office hours are 8:00 a.m. and 4:30 p.m. Monday - Friday.  Please note that voicemails left after 4:00 p.m. may not be returned until the following business day.  We are closed weekends and major holidays.  You do have access to a nurse 24-7, just call the main number to the clinic [phone] and do not press any options, hold on the line and a nurse will answer the phone.    For prescription refill requests, have your pharmacy contact our office and allow 72 hours.    Due to Covid, you will need to wear a mask upon entering  the hospital. If you do not have a mask, a mask will be given to you at the Main Entrance upon arrival. For doctor visits, patients may have 1 support person age 18 or older with them. For treatment visits, patients can not have anyone with them due to social distancing guidelines and our immunocompromised population.      

## 2022-04-28 NOTE — Patient Instructions (Addendum)
[facility] [place]  Discharge Instructions: Thank you for choosing [facility] to provide your oncology and hematology care.  If you have a lab appointment with the Cancer Center - please note that after April 8th, 2024, all labs will be drawn in the cancer center.  You do not have to check in or register with the main entrance as you have in the past but will complete your check-in in the cancer center.  Wear comfortable clothing and clothing appropriate for easy access to any Portacath or PICC line.   We strive to give you quality time with your provider. You may need to reschedule your appointment if you arrive late (15 or more minutes).  Arriving late affects you and other patients whose appointments are after yours.  Also, if you miss three or more appointments without notifying the office, you may be dismissed from the clinic at the provider's discretion.      For prescription refill requests, have your pharmacy contact our office and allow 72 hours for refills to be completed.    Today you received the following chemotherapy and/or immunotherapy agents Docetaxel and Carboplatin   To help prevent nausea and vomiting after your treatment, we encourage you to take your nausea medication as directed.  Docetaxel Injection What is this medication? DOCETAXEL (doe se TAX el) treats some types of cancer. It works by slowing down the growth of cancer cells. This medicine may be used for other purposes; ask your health care provider or pharmacist if you have questions. COMMON BRAND NAME(S): Docefrez, Taxotere What should I tell my care team before I take this medication? They need to know if you have any of these conditions: Kidney disease Liver disease Low white blood cell levels Tingling of the fingers or toes or other nerve disorder An unusual or allergic reaction to docetaxel, polysorbate 80, other medications, foods, dyes, or preservatives Pregnant or trying to  get pregnant Breast-feeding How should I use this medication? This medication is injected into a vein. It is given by your care team in a hospital or clinic setting. Talk to your care team about the use of this medication in children. Special care may be needed. Overdosage: If you think you have taken too much of this medicine contact a poison control center or emergency room at once. NOTE: This medicine is only for you. Do not share this medicine with others. What if I miss a dose? Keep appointments for follow-up doses. It is important not to miss your dose. Call your care team if you are unable to keep an appointment. What may interact with this medication? Do not take this medication with any of the following: Live virus vaccines This medication may also interact with the following: Certain antibiotics, such as clarithromycin, telithromycin Certain antivirals for HIV or hepatitis Certain medications for fungal infections, such as itraconazole, ketoconazole, voriconazole Grapefruit juice Nefazodone Supplements, such as St. John's wort This list may not describe all possible interactions. Give your health care provider a list of all the medicines, herbs, non-prescription drugs, or dietary supplements you use. Also tell them if you smoke, drink alcohol, or use illegal drugs. Some items may interact with your medicine. What should I watch for while using this medication? This medication may make you feel generally unwell. This is not uncommon as chemotherapy can affect healthy cells as well as cancer cells. Report any side effects. Continue your course of treatment even though you feel ill unless your care  team tells you to stop. You may need blood work done while you are taking this medication. This medication can cause serious side effects and infusion reactions. To reduce the risk, your care team may give you other medications to take before receiving this one. Be sure to follow the  directions from your care team. This medication may increase your risk of getting an infection. Call your care team for advice if you get a fever, chills, sore throat, or other symptoms of a cold or flu. Do not treat yourself. Try to avoid being around people who are sick. Avoid taking medications that contain aspirin, acetaminophen, ibuprofen, naproxen, or ketoprofen unless instructed by your care team. These medications may hide a fever. Be careful brushing or flossing your teeth or using a toothpick because you may get an infection or bleed more easily. If you have any dental work done, tell your dentist you are receiving this medication. Some products may contain alcohol. Ask your care team if this medication contains alcohol. Be sure to tell all care teams you are taking this medicine. Certain medications, like metronidazole and disulfiram, can cause an unpleasant reaction when taken with alcohol. The reaction includes flushing, headache, nausea, vomiting, sweating, and increased thirst. The reaction can last from 30 minutes to several hours. This medication may affect your coordination, reaction time, or judgement. Do not drive or operate machinery until you know how this medication affects you. Sit up or stand slowly to reduce the risk of dizzy or fainting spells. Drinking alcohol with this medication can increase the risk of these side effects. Talk to your care team about your risk of cancer. You may be more at risk for certain types of cancer if you take this medication. Talk to your care team if you wish to become pregnant or think you might be pregnant. This medication can cause serious birth defects if taken during pregnancy or if you get pregnant within 2 months after stopping therapy. A negative pregnancy test is required before starting this medication. A reliable form of contraception is recommended while taking this medication and for 2 months after stopping it. Talk to your care team about  reliable forms of contraception. Do not breast-feed while taking this medication and for 1 week after stopping therapy. Use a condom during sex and for 4 months after stopping therapy. Tell your care team right away if you think your partner might be pregnant. This medication can cause serious birth defects. This medication may cause infertility. Talk to your care team if you are concerned about your fertility. What side effects may I notice from receiving this medication? Side effects that you should report to your care team as soon as possible: Allergic reactions--skin rash, itching, hives, swelling of the face, lips, tongue, or throat Change in vision such as blurry vision, seeing halos around lights, vision loss Infection--fever, chills, cough, or sore throat Infusion reactions--chest pain, shortness of breath or trouble breathing, feeling faint or lightheaded Low red blood cell level--unusual weakness or fatigue, dizziness, headache, trouble breathing Pain, tingling, or numbness in the hands or feet Painful swelling, warmth, or redness of the skin, blisters or sores at the infusion site Redness, blistering, peeling, or loosening of the skin, including inside the mouth Sudden or severe stomach pain, bloody diarrhea, fever, nausea, vomiting Swelling of the ankles, hands, or feet Tumor lysis syndrome (TLS)--nausea, vomiting, diarrhea, decrease in the amount of urine, dark urine, unusual weakness or fatigue, confusion, muscle pain or cramps, fast or  irregular heartbeat, joint pain Unusual bruising or bleeding Side effects that usually do not require medical attention (report to your care team if they continue or are bothersome): Change in nail shape, thickness, or color Change in taste Hair loss Increased tears This list may not describe all possible side effects. Call your doctor for medical advice about side effects. You may report side effects to FDA at 1-800-FDA-1088. Where should I keep  my medication? This medication is given in a hospital or clinic. It will not be stored at home. NOTE: This sheet is a summary. It may not cover all possible information. If you have questions about this medicine, talk to your doctor, pharmacist, or health care provider.  2023 Elsevier/Gold Standard (2007-02-13 00:00:00)   Cyclophosphamide Injection What is this medication? CYCLOPHOSPHAMIDE (sye kloe FOSS fa mide) treats some types of cancer. It works by slowing down the growth of cancer cells. This medicine may be used for other purposes; ask your health care provider or pharmacist if you have questions. COMMON BRAND NAME(S): Cyclophosphamide, Cytoxan, Neosar What should I tell my care team before I take this medication? They need to know if you have any of these conditions: Heart disease Irregular heartbeat or rhythm Infection Kidney problems Liver disease Low blood cell levels (white cells, platelets, or red blood cells) Lung disease Previous radiation Trouble passing urine An unusual or allergic reaction to cyclophosphamide, other medications, foods, dyes, or preservatives Pregnant or trying to get pregnant Breast-feeding How should I use this medication? This medication is injected into a vein. It is given by your care team in a hospital or clinic setting. Talk to your care team about the use of this medication in children. Special care may be needed. Overdosage: If you think you have taken too much of this medicine contact a poison control center or emergency room at once. NOTE: This medicine is only for you. Do not share this medicine with others. What if I miss a dose? Keep appointments for follow-up doses. It is important not to miss your dose. Call your care team if you are unable to keep an appointment. What may interact with this medication? Amphotericin B Amiodarone Azathioprine Certain antivirals for HIV or hepatitis Certain medications for blood pressure, such as  enalapril, lisinopril, quinapril Cyclosporine Diuretics Etanercept Indomethacin Medications that relax muscles Metronidazole Natalizumab Tamoxifen Warfarin This list may not describe all possible interactions. Give your health care provider a list of all the medicines, herbs, non-prescription drugs, or dietary supplements you use. Also tell them if you smoke, drink alcohol, or use illegal drugs. Some items may interact with your medicine. What should I watch for while using this medication? This medication may make you feel generally unwell. This is not uncommon as chemotherapy can affect healthy cells as well as cancer cells. Report any side effects. Continue your course of treatment even though you feel ill unless your care team tells you to stop. You may need blood work while you are taking this medication. This medication may increase your risk of getting an infection. Call your care team for advice if you get a fever, chills, sore throat, or other symptoms of a cold or flu. Do not treat yourself. Try to avoid being around people who are sick. Avoid taking medications that contain aspirin, acetaminophen, ibuprofen, naproxen, or ketoprofen unless instructed by your care team. These medications may hide a fever. Be careful brushing or flossing your teeth or using a toothpick because you may get an infection or  bleed more easily. If you have any dental work done, tell your dentist you are receiving this medication. Drink water or other fluids as directed. Urinate often, even at night. Some products may contain alcohol. Ask your care team if this medication contains alcohol. Be sure to tell all care teams you are taking this medicine. Certain medicines, like metronidazole and disulfiram, can cause an unpleasant reaction when taken with alcohol. The reaction includes flushing, headache, nausea, vomiting, sweating, and increased thirst. The reaction can last from 30 minutes to several hours. Talk to  your care team if you wish to become pregnant or think you might be pregnant. This medication can cause serious birth defects if taken during pregnancy and for 1 year after the last dose. A negative pregnancy test is required before starting this medication. A reliable form of contraception is recommended while taking this medication and for 1 year after the last dose. Talk to your care team about reliable forms of contraception. Do not father a child while taking this medication and for 4 months after the last dose. Use a condom during this time period. Do not breast-feed while taking this medication or for 1 week after the last dose. This medication may cause infertility. Talk to your care team if you are concerned about your fertility. Talk to your care team about your risk of cancer. You may be more at risk for certain types of cancer if you take this medication. What side effects may I notice from receiving this medication? Side effects that you should report to your care team as soon as possible: Allergic reactions--skin rash, itching, hives, swelling of the face, lips, tongue, or throat Dry cough, shortness of breath or trouble breathing Heart failure--shortness of breath, swelling of the ankles, feet, or hands, sudden weight gain, unusual weakness or fatigue Heart muscle inflammation--unusual weakness or fatigue, shortness of breath, chest pain, fast or irregular heartbeat, dizziness, swelling of the ankles, feet, or hands Heart rhythm changes--fast or irregular heartbeat, dizziness, feeling faint or lightheaded, chest pain, trouble breathing Infection--fever, chills, cough, sore throat, wounds that don't heal, pain or trouble when passing urine, general feeling of discomfort or being unwell Kidney injury--decrease in the amount of urine, swelling of the ankles, hands, or feet Liver injury--right upper belly pain, loss of appetite, nausea, light-colored stool, dark yellow or brown urine,  yellowing skin or eyes, unusual weakness or fatigue Low red blood cell level--unusual weakness or fatigue, dizziness, headache, trouble breathing Low sodium level--muscle weakness, fatigue, dizziness, headache, confusion Red or dark brown urine Unusual bruising or bleeding Side effects that usually do not require medical attention (report to your care team if they continue or are bothersome): Hair loss Irregular menstrual cycles or spotting Loss of appetite Nausea Pain, redness, or swelling with sores inside the mouth or throat Vomiting This list may not describe all possible side effects. Call your doctor for medical advice about side effects. You may report side effects to FDA at 1-800-FDA-1088. Where should I keep my medication? This medication is given in a hospital or clinic. It will not be stored at home. NOTE: This sheet is a summary. It may not cover all possible information. If you have questions about this medicine, talk to your doctor, pharmacist, or health care provider.  2023 Elsevier/Gold Standard (2021-02-12 00:00:00)   BELOW ARE SYMPTOMS THAT SHOULD BE REPORTED IMMEDIATELY: *FEVER GREATER THAN 100.4 F (38 C) OR HIGHER *CHILLS OR SWEATING *NAUSEA AND VOMITING THAT IS NOT CONTROLLED WITH YOUR NAUSEA  MEDICATION *UNUSUAL SHORTNESS OF BREATH *UNUSUAL BRUISING OR BLEEDING *URINARY PROBLEMS (pain or burning when urinating, or frequent urination) *BOWEL PROBLEMS (unusual diarrhea, constipation, pain near the anus) TENDERNESS IN MOUTH AND THROAT WITH OR WITHOUT PRESENCE OF ULCERS (sore throat, sores in mouth, or a toothache) UNUSUAL RASH, SWELLING OR PAIN  UNUSUAL VAGINAL DISCHARGE OR ITCHING   Items with * indicate a potential emergency and should be followed up as soon as possible or go to the Emergency Department if any problems should occur.  Please show the CHEMOTHERAPY ALERT CARD or IMMUNOTHERAPY ALERT CARD at check-in to the Emergency Department and triage  nurse.  Should you have questions after your visit or need to cancel or reschedule your appointment, please contact [facility] [phone]  and follow the prompts.  Office hours are 8:00 a.m. to 4:30 p.m. Monday - Friday. Please note that voicemails left after 4:00 p.m. may not be returned until the following business day.  We are closed weekends and major holidays. You have access to a nurse at all times for urgent questions. Please call the main number to the clinic [phone] and follow the prompts.  For any non-urgent questions, you may also contact your provider using MyChart. We now offer e-Visits for anyone 34 and older to request care online for non-urgent symptoms. For details visit [email or web address].   Also download the MyChart app! Go to the app store, search "MyChart", open the app, select [place], and log in with your MyChart username and password.  [facility] [place]  Discharge Instructions: Thank you for choosing [facility] to provide your oncology and hematology care.  If you have a lab appointment with the Cancer Center - please note that after April 8th, 2024, all labs will be drawn in the cancer center.  You do not have to check in or register with the main entrance as you have in the past but will complete your check-in in the cancer center.  Wear comfortable clothing and clothing appropriate for easy access to any Portacath or PICC line.   We strive to give you quality time with your provider. You may need to reschedule your appointment if you arrive late (15 or more minutes).  Arriving late affects you and other patients whose appointments are after yours.  Also, if you miss three or more appointments without notifying the office, you may be dismissed from the clinic at the provider's discretion.

## 2022-04-30 ENCOUNTER — Inpatient Hospital Stay: Payer: Medicare PPO

## 2022-04-30 VITALS — BP 99/59 | HR 72 | Temp 97.8°F | Resp 18

## 2022-04-30 DIAGNOSIS — Z5189 Encounter for other specified aftercare: Secondary | ICD-10-CM | POA: Diagnosis not present

## 2022-04-30 DIAGNOSIS — Z5111 Encounter for antineoplastic chemotherapy: Secondary | ICD-10-CM | POA: Diagnosis not present

## 2022-04-30 DIAGNOSIS — Z171 Estrogen receptor negative status [ER-]: Secondary | ICD-10-CM | POA: Diagnosis not present

## 2022-04-30 DIAGNOSIS — Z79899 Other long term (current) drug therapy: Secondary | ICD-10-CM | POA: Diagnosis not present

## 2022-04-30 DIAGNOSIS — C50411 Malignant neoplasm of upper-outer quadrant of right female breast: Secondary | ICD-10-CM | POA: Diagnosis not present

## 2022-04-30 MED ORDER — PEGFILGRASTIM-CBQV 6 MG/0.6ML ~~LOC~~ SOSY
6.0000 mg | PREFILLED_SYRINGE | Freq: Once | SUBCUTANEOUS | Status: AC
  Administered 2022-04-30: 6 mg via SUBCUTANEOUS
  Filled 2022-04-30: qty 0.6

## 2022-04-30 NOTE — Progress Notes (Signed)
Patient tolerated injection with no complaints voiced. Site clean and dry with no bruising or swelling noted at site. See MAR for details. Band aid applied.  Patient stable during and after injection. VSS with discharge and left in satisfactory condition with no s/s of distress noted.  

## 2022-04-30 NOTE — Patient Instructions (Signed)
[facility] [place]  Discharge Instructions: Thank you for choosing [facility] to provide your oncology and hematology care.  If you have a lab appointment with the Cancer Center - please note that after April 8th, 2024, all labs will be drawn in the cancer center.  You do not have to check in or register with the main entrance as you have in the past but will complete your check-in in the cancer center.  Wear comfortable clothing and clothing appropriate for easy access to any Portacath or PICC line.   We strive to give you quality time with your provider. You may need to reschedule your appointment if you arrive late (15 or more minutes).  Arriving late affects you and other patients whose appointments are after yours.  Also, if you miss three or more appointments without notifying the office, you may be dismissed from the clinic at the provider's discretion.      For prescription refill requests, have your pharmacy contact our office and allow 72 hours for refills to be completed.    Today you received the following Udenyca, return as scheduled.   To help prevent nausea and vomiting after your treatment, we encourage you to take your nausea medication as directed.  BELOW ARE SYMPTOMS THAT SHOULD BE REPORTED IMMEDIATELY: *FEVER GREATER THAN 100.4 F (38 C) OR HIGHER *CHILLS OR SWEATING *NAUSEA AND VOMITING THAT IS NOT CONTROLLED WITH YOUR NAUSEA MEDICATION *UNUSUAL SHORTNESS OF BREATH *UNUSUAL BRUISING OR BLEEDING *URINARY PROBLEMS (pain or burning when urinating, or frequent urination) *BOWEL PROBLEMS (unusual diarrhea, constipation, pain near the anus) TENDERNESS IN MOUTH AND THROAT WITH OR WITHOUT PRESENCE OF ULCERS (sore throat, sores in mouth, or a toothache) UNUSUAL RASH, SWELLING OR PAIN  UNUSUAL VAGINAL DISCHARGE OR ITCHING   Items with * indicate a potential emergency and should be followed up as soon as possible or go to the Emergency Department if  any problems should occur.  Please show the CHEMOTHERAPY ALERT CARD or IMMUNOTHERAPY ALERT CARD at check-in to the Emergency Department and triage nurse.  Should you have questions after your visit or need to cancel or reschedule your appointment, please contact [facility] AT [facility] [phone]  and follow the prompts.  Office hours are 8:00 a.m. to 4:30 p.m. Monday - Friday. Please note that voicemails left after 4:00 p.m. may not be returned until the following business day.  We are closed weekends and major holidays. You have access to a nurse at all times for urgent questions. Please call the main number to the clinic [phone] and follow the prompts.  For any non-urgent questions, you may also contact your provider using MyChart. We now offer e-Visits for anyone 57 and older to request care online for non-urgent symptoms. For details visit [email or web address].   Also download the MyChart app! Go to the app store, search "MyChart", open the app, select [place], and log in with your MyChart username and password.

## 2022-05-18 NOTE — Progress Notes (Signed)
[facility] [address] [place] [address]    Clinic Day:  05/19/2022  Referring physician: Donetta Potts, MD  Patient Care Team: Donetta Potts, MD as PCP - General (Internal Medicine) Doreatha Massed, MD as Medical Oncologist (Medical Oncology) Therese Sarah, RN as Oncology Nurse Navigator (Medical Oncology)   ASSESSMENT & PLAN:   Assessment: 1.  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  Social/family history: - Lives at home with her husband.  Retired Runner, broadcasting/film/video.  Non-smoker. - Mother died of AML at age 75.  Maternal aunt had AML.  Maternal grandmother had leukemia, unknown type.  Maternal uncle had chronic leukemia.  Paternal aunt had bladder cancer.  Paternal first cousin had breast cancer.  Her son was diagnosed with GBM at age 60.    Plan: 1.  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  She received 90% dose of TC at last cycle.  She felt tired for few days.  No major GI side effects. - Reviewed labs today: Normal LFTs and creatinine.  CBC was grossly normal with mild thrombocytopenia.  She denies any numbness or tingling. - Recommend proceeding with cycle 4 at the same dose. - Will make referral to radiation oncology. - RTC 3 months for follow-up with repeat labs and tumor marker.    Orders Placed This Encounter  Procedures   CBC  with Differential    Standing Status:   Future    Standing Expiration Date:   05/19/2023   Comprehensive metabolic panel    Standing Status:   Future    Standing Expiration Date:   05/19/2023   Cancer antigen 15-3    Standing Status:   Future    Standing Expiration Date:   05/19/2023      I,Katie Daubenspeck,acting as a scribe for Doreatha Massed, MD.,have documented all relevant documentation on the behalf of Doreatha Massed, MD,as directed by  Doreatha Massed, MD while in the presence of Doreatha Massed, MD.   I, Doreatha Massed MD, have reviewed the above documentation for accuracy and completeness, and I agree with the above.   Doreatha Massed, MD   5/13/202410:35 AM  CHIEF COMPLAINT:   Diagnosis: Triple negative right breast cancer    Cancer Staging  Carcinoma of upper-outer quadrant of right breast in female, estrogen receptor negative (HCC) Staging form: Breast, AJCC 8th Edition - Clinical stage from 03/04/2022: Stage IB (cT1c, cN0, cM0, G3, ER-, PR-, HER2-) - Signed by Doreatha Massed, MD on 03/04/2022    Prior Therapy: right lumpectomy and SLNB on 02/05/2022   Current Therapy:  Docetaxel and cyclophosphamide    HISTORY OF PRESENT ILLNESS:   Oncology History  Carcinoma of upper-outer quadrant of right breast in female, estrogen receptor negative (HCC)  01/23/2022 Initial Diagnosis   Carcinoma of upper-outer quadrant of right breast in female, estrogen receptor negative (HCC)   03/04/2022 Cancer Staging  Staging form: Breast, AJCC 8th Edition - Clinical stage from 03/04/2022: Stage IB (cT1c, cN0, cM0, G3, ER-, PR-, HER2-) - Signed by Doreatha Massed, MD on 03/04/2022 Histopathologic type: Infiltrating duct carcinoma, NOS Stage prefix: Initial diagnosis Nuclear grade: G3 Histologic grading system: 3 grade system   03/17/2022 -  Chemotherapy   Patient is on Treatment Plan : BREAST TC q21d     04/21/2022 Genetic Testing   Negative  genetic testing on the Custom Cancer Panel.  The report date is April 21, 2022.  The Custom-Cancer offered by [facility] includes sequencing and/or deletion/duplication analysis of the following 125 genes:  ADA, AIP, ALK, ANKRD26*, APC*, ATM*, AXIN2, BAP1, BARD1, BLM, BMPR1A, BRCA1, BRCA2, BRIP1, CARD11, CARMIL2, CASP8, CBL, CD27, CDC73, CDH1, CDK4, CDKN1B, CDKN2A (p14ARF), CDKN2A (p16INK4a), CEBPA, CHEK2, CTLA4, CTNNA1, CTPS1, DDX41, DICER1*, DOCK8, EGFR, ELANE, EPCAM*, ERCC6L2, ETV6, FADD, FAS, FASLG, FCHO1, FH*, FLCN, G6PC3, GATA2, GFI1*, GREM1*, HAX1, HOXB13, IKZF1, IL2RA, IL2RB, ITK, KIT, KRAS, LZTR1, MAGT1, MAX*, MBD4, MCM4, MECOM, MEN1*, MET*, MITF, MLH1*, MSH2*, MSH3*, MSH6*, MUTYH, NBN, NF1*, NF2, NTHL1, PALB2, PDGFRA, PIK3CD, PIK3R1, PMS2*, POLD1*, POLE, POT1, PRKAR1A, PRKCD, PTCH1, PTEN*, PTPN11, RAC2, RAD51C, RAD51D, RASGRP1, RB1*, RET, RHOH, RMRP, RTEL1, RUNX1, SAMD9, SAMD9L, SDHA*, SDHAF2, SDHB, SDHC*, SDHD, SH2D1A, SMAD4, SMARCA4, SMARCB1, SMARCE1, SRP72, STAT3, STK11, STK4, STXBP2, SUFU, TERC, TERT, TMEM127, TNFRSF13B, TP53, TPP2, TSC1*, TSC2, VHL, WAS, and XIAP.       INTERVAL HISTORY:   Lo is a 77 y.o. female presenting to clinic today for follow up of Triple negative right breast cancer. She was last seen by me on 04/28/22.  Today, she states that she is doing well overall. Her appetite level is at 100%. Her energy level is at 70%.  PAST MEDICAL HISTORY:   Past Medical History: Past Medical History:  Diagnosis Date   Arthritis    Family history of breast cancer    Family history of kidney cancer    Family history of leukemia    GERD (gastroesophageal reflux disease)    Hypercholesteremia    Hypertension    Pre-diabetes     Surgical History: Past Surgical History:  Procedure Laterality Date   BREAST BIOPSY Right 01/14/2022   [place] RT BREAST BX W LOC DEV 1ST LESION IMG BX SPEC US GUIDE 01/14/2022 Edwin Cap, MD [facility]   CATARACT EXTRACTION W/PHACO Left  12/18/2016   Procedure: CATARACT EXTRACTION PHACO AND INTRAOCULAR LENS PLACEMENT (IOC);  Surgeon: Fabio Pierce, MD;  Location: [facility];  Service: Ophthalmology;  Laterality: Left;  CDE: 9.55   CATARACT EXTRACTION W/PHACO Right 08/06/2018   Procedure: CATARACT EXTRACTION PHACO AND INTRAOCULAR LENS PLACEMENT (IOC);  Surgeon: Fabio Pierce, MD;  Location: [facility];  Service: Ophthalmology;  Laterality: Right;  CDE: 13.46   PARTIAL MASTECTOMY WITH AXILLARY SENTINEL LYMPH NODE BIOPSY Right 02/05/2022   PORTACATH PLACEMENT Left 03/14/2022   Procedure: INSERTION PORT-A-CATH;  Surgeon: Lucretia Roers, MD;  Location: [facility];  Service: General;  Laterality: Left;   TUBAL LIGATION      Social History: Social History   Socioeconomic History   Marital status: Married    Spouse name: Not on file   Number of children: Not on file   Years of education: Not on file   Highest education level: Not on file  Occupational History   Not on file  Tobacco Use   Smoking status: Never   Smokeless tobacco: Never  Vaping Use   Vaping Use: Never used  Substance and Sexual Activity   Alcohol use:  Yes    Comment: very rarely   Drug use: No   Sexual activity: Yes    Birth control/protection: Post-menopausal  Other Topics Concern   Not on file  Social History Narrative   Not on file   Social Determinants of Health   Financial Resource Strain: Not on file  Food Insecurity: No Food Insecurity (03/05/2022)   Hunger Vital Sign    Worried About Running Out of Food in the Last Year: Never true    Ran Out of Food in the Last Year: Never true  Transportation Needs: No Transportation Needs (03/05/2022)   PRAPARE - Transportation    Lack of Transportation (Medical): No    Lack of Transportation (Non-Medical): No  Physical Activity: Not on file  Stress: Not on file  Social Connections: Not on file  Intimate Partner Violence: Not At Risk (03/05/2022)   Humiliation, Afraid, Rape, and Kick questionnaire    Fear  of Current or Ex-Partner: No    Emotionally Abused: No    Physically Abused: No    Sexually Abused: No    Family History: Family History  Problem Relation Age of Onset   Atrial fibrillation Mother    Leukemia Mother 49       AML   Irregular heart beat Father    Leukemia Maternal Aunt 80       AML   Leukemia Maternal Uncle 65       CLL   Kidney cancer Paternal Aunt 91   Leukemia Maternal Grandmother 36   Stroke Paternal Grandmother    Alcoholism Paternal Grandfather    Brain cancer Son 50       Glioblastoma   Melanoma Cousin        Maternal first cousin   Breast cancer Cousin        paternal first cousin   Breast cancer Cousin        paternal first cousin's daughter    Current Medications:  Current Outpatient Medications:    acetaminophen (TYLENOL) 500 MG tablet, Take 500 mg by mouth every 6 (six) hours as needed for moderate pain., Disp: , Rfl:    ascorbic acid (VITAMIN C) 500 MG tablet, Take 500 mg by mouth in the morning., Disp: , Rfl:    cetirizine (ZYRTEC) 10 MG tablet, Take 10 mg by mouth daily as needed for allergies., Disp: , Rfl:    Cholecalciferol (VITAMIN D) 50 MCG (2000 UT) tablet, Take 2,000 Units by mouth in the morning., Disp: , Rfl:    CYCLOPHOSPHAMIDE IV, Inject into the vein every 21 ( twenty-one) days., Disp: , Rfl:    DOCETAXEL IV, Inject into the vein every 21 ( twenty-one) days., Disp: , Rfl:    ELDERBERRY PO, Take 10 mLs by mouth in the morning., Disp: , Rfl:    fluticasone (FLONASE) 50 MCG/ACT nasal spray, Place 2 sprays into both nostrils in the morning., Disp: , Rfl:    fosinopril-hydrochlorothiazide (MONOPRIL-HCT) 20-12.5 MG tablet, Take 1 tablet by mouth at bedtime., Disp: , Rfl:    lidocaine-prilocaine (EMLA) cream, Apply 1 Application topically as needed. Apply a quarter-sized amount to port a cath site and cover with plastic wrap one hour prior to infusion appointments, Disp: 30 g, Rfl: 3   pantoprazole (PROTONIX) 40 MG tablet, Take 40 mg by  mouth daily before breakfast., Disp: , Rfl:    polyethylene glycol (MIRALAX / GLYCOLAX) 17 g packet, Take 17 g by mouth at bedtime., Disp: , Rfl:    Polyethylene Glycol  400 (BLINK TEARS OP), Place 2 drops into both eyes daily as needed (dry/irritated eyes.)., Disp: , Rfl:    prochlorperazine (COMPAZINE) 10 MG tablet, Take 1 tablet (10 mg total) by mouth every 6 (six) hours as needed for nausea or vomiting., Disp: 60 tablet, Rfl: 3   simvastatin (ZOCOR) 10 MG tablet, Take 10 mg by mouth every evening., Disp: , Rfl:    Zinc 50 MG TABS, Take 50 mg by mouth in the morning., Disp: , Rfl:  No current facility-administered medications for this visit.  Facility-Administered Medications Ordered in Other Visits:    cyclophosphamide (CYTOXAN) 900 mg in sodium chloride 0.9 % 250 mL chemo infusion, 540 mg/m2 (Treatment Plan Recorded), Intravenous, Once, Doreatha Massed, MD   DOCEtaxel (TAXOTERE) 111 mg in sodium chloride 0.9 % 250 mL chemo infusion, 67.5 mg/m2 (Treatment Plan Recorded), Intravenous, Once, Doreatha Massed, MD   heparin lock flush 100 unit/mL, 500 Units, Intracatheter, Once PRN, Doreatha Massed, MD   sodium chloride flush (NS) 0.9 % injection 10 mL, 10 mL, Intracatheter, PRN, Doreatha Massed, MD   Allergies: Allergies  Allergen Reactions   Macrobid [Nitrofurantoin Macrocrystal] Nausea And Vomiting    REVIEW OF SYSTEMS:   Review of Systems  Constitutional:  Negative for chills, fatigue and fever.  HENT:   Negative for lump/mass, mouth sores, nosebleeds, sore throat and trouble swallowing.   Eyes:  Negative for eye problems.  Respiratory:  Negative for cough and shortness of breath.   Cardiovascular:  Negative for chest pain, leg swelling and palpitations.  Gastrointestinal:  Negative for abdominal pain, constipation, diarrhea, nausea and vomiting.  Genitourinary:  Negative for bladder incontinence, difficulty urinating, dysuria, frequency, hematuria and nocturia.    Musculoskeletal:  Negative for arthralgias, back pain, flank pain, myalgias and neck pain.  Skin:  Negative for itching and rash.  Neurological:  Positive for headaches. Negative for dizziness and numbness.  Hematological:  Does not bruise/bleed easily.  Psychiatric/Behavioral:  Positive for sleep disturbance. Negative for depression and suicidal ideas. The patient is not nervous/anxious.   All other systems reviewed and are negative.    VITALS:   There were no vitals taken for this visit.  Wt Readings from Last 3 Encounters:  05/19/22 135 lb 3.2 oz (61.3 kg)  04/28/22 136 lb (61.7 kg)  04/07/22 136 lb 3.2 oz (61.8 kg)    There is no height or weight on file to calculate BMI.  Performance status (ECOG): 1 - Symptomatic but completely ambulatory  PHYSICAL EXAM:   Physical Exam Vitals and nursing note reviewed. Exam conducted with a chaperone present.  Constitutional:      Appearance: Normal appearance.  Cardiovascular:     Rate and Rhythm: Normal rate and regular rhythm.     Pulses: Normal pulses.     Heart sounds: Normal heart sounds.  Pulmonary:     Effort: Pulmonary effort is normal.     Breath sounds: Normal breath sounds.  Abdominal:     Palpations: Abdomen is soft. There is no hepatomegaly, splenomegaly or mass.     Tenderness: There is no abdominal tenderness.  Musculoskeletal:     Right lower leg: No edema.     Left lower leg: No edema.  Lymphadenopathy:     Cervical: No cervical adenopathy.     Right cervical: No superficial, deep or posterior cervical adenopathy.    Left cervical: No superficial, deep or posterior cervical adenopathy.     Upper Body:     Right upper body: No supraclavicular  or axillary adenopathy.     Left upper body: No supraclavicular or axillary adenopathy.  Neurological:     General: No focal deficit present.     Mental Status: She is alert and oriented to person, place, and time.  Psychiatric:        Mood and Affect: Mood normal.         Behavior: Behavior normal.     LABS:      Latest Ref Rng & Units 05/19/2022    8:07 AM 04/28/2022    9:01 AM 04/07/2022    8:49 AM  CBC  WBC 4.0 - 10.5 K/uL 7.9  5.9  6.4   Hemoglobin 12.0 - 15.0 g/dL 16.1  09.6  04.5   Hematocrit 36.0 - 46.0 % 34.6  36.0  37.8   Platelets 150 - 400 K/uL 147  150  178       Latest Ref Rng & Units 05/19/2022    8:07 AM 04/28/2022    9:01 AM 04/07/2022    8:49 AM  CMP  Glucose 70 - 99 mg/dL 409  811  914   BUN 8 - 23 mg/dL 10  15  18    Creatinine 0.44 - 1.00 mg/dL 7.82  9.56  2.13   Sodium 135 - 145 mmol/L 138  138  137   Potassium 3.5 - 5.1 mmol/L 3.6  3.7  3.9   Chloride 98 - 111 mmol/L 105  104  104   CO2 22 - 32 mmol/L 25  23  23    Calcium 8.9 - 10.3 mg/dL 8.8  8.9  8.8   Total Protein 6.5 - 8.1 g/dL 6.5  6.7  7.1   Total Bilirubin 0.3 - 1.2 mg/dL 0.7  0.6  0.7   Alkaline Phos 38 - 126 U/L 49  45  46   AST 15 - 41 U/L 16  16  18    ALT 0 - 44 U/L 15  14  16       No results found for: "CEA1", "CEA" / No results found for: "CEA1", "CEA" No results found for: "PSA1" No results found for: "YQM578" No results found for: "CAN125"  No results found for: "TOTALPROTELP", "ALBUMINELP", "A1GS", "A2GS", "BETS", "BETA2SER", "GAMS", "MSPIKE", "SPEI" No results found for: "TIBC", "FERRITIN", "IRONPCTSAT" No results found for: "LDH"   STUDIES:   No results found.

## 2022-05-19 ENCOUNTER — Inpatient Hospital Stay: Payer: Medicare PPO

## 2022-05-19 ENCOUNTER — Inpatient Hospital Stay: Payer: Medicare PPO | Admitting: Hematology

## 2022-05-19 ENCOUNTER — Inpatient Hospital Stay: Payer: Medicare PPO | Attending: Hematology

## 2022-05-19 VITALS — BP 116/68 | HR 70 | Temp 98.1°F | Resp 18

## 2022-05-19 VITALS — BP 122/74 | HR 92 | Temp 98.3°F | Resp 18 | Wt 135.2 lb

## 2022-05-19 DIAGNOSIS — Z171 Estrogen receptor negative status [ER-]: Secondary | ICD-10-CM

## 2022-05-19 DIAGNOSIS — C50411 Malignant neoplasm of upper-outer quadrant of right female breast: Secondary | ICD-10-CM

## 2022-05-19 DIAGNOSIS — Z5111 Encounter for antineoplastic chemotherapy: Secondary | ICD-10-CM | POA: Diagnosis not present

## 2022-05-19 DIAGNOSIS — Z5189 Encounter for other specified aftercare: Secondary | ICD-10-CM | POA: Diagnosis not present

## 2022-05-19 DIAGNOSIS — Z95828 Presence of other vascular implants and grafts: Secondary | ICD-10-CM

## 2022-05-19 DIAGNOSIS — D696 Thrombocytopenia, unspecified: Secondary | ICD-10-CM | POA: Insufficient documentation

## 2022-05-19 LAB — CBC WITH DIFFERENTIAL/PLATELET
Abs Immature Granulocytes: 0.03 10*3/uL (ref 0.00–0.07)
Basophils Absolute: 0.1 10*3/uL (ref 0.0–0.1)
Basophils Relative: 1 %
Eosinophils Absolute: 0 10*3/uL (ref 0.0–0.5)
Eosinophils Relative: 0 %
HCT: 34.6 % — ABNORMAL LOW (ref 36.0–46.0)
Hemoglobin: 11.6 g/dL — ABNORMAL LOW (ref 12.0–15.0)
Immature Granulocytes: 0 %
Lymphocytes Relative: 12 %
Lymphs Abs: 0.9 10*3/uL (ref 0.7–4.0)
MCH: 32.3 pg (ref 26.0–34.0)
MCHC: 33.5 g/dL (ref 30.0–36.0)
MCV: 96.4 fL (ref 80.0–100.0)
Monocytes Absolute: 0.8 10*3/uL (ref 0.1–1.0)
Monocytes Relative: 11 %
Neutro Abs: 6 10*3/uL (ref 1.7–7.7)
Neutrophils Relative %: 76 %
Platelets: 147 10*3/uL — ABNORMAL LOW (ref 150–400)
RBC: 3.59 MIL/uL — ABNORMAL LOW (ref 3.87–5.11)
RDW: 15 % (ref 11.5–15.5)
WBC: 7.9 10*3/uL (ref 4.0–10.5)
nRBC: 0 % (ref 0.0–0.2)

## 2022-05-19 LAB — COMPREHENSIVE METABOLIC PANEL
ALT: 15 U/L (ref 0–44)
AST: 16 U/L (ref 15–41)
Albumin: 3.7 g/dL (ref 3.5–5.0)
Alkaline Phosphatase: 49 U/L (ref 38–126)
Anion gap: 8 (ref 5–15)
BUN: 10 mg/dL (ref 8–23)
CO2: 25 mmol/L (ref 22–32)
Calcium: 8.8 mg/dL — ABNORMAL LOW (ref 8.9–10.3)
Chloride: 105 mmol/L (ref 98–111)
Creatinine, Ser: 0.72 mg/dL (ref 0.44–1.00)
GFR, Estimated: >60 mL/min (ref >60)
Glucose, Bld: 120 mg/dL — ABNORMAL HIGH (ref 70–99)
Potassium: 3.6 mmol/L (ref 3.5–5.1)
Sodium: 138 mmol/L (ref 135–145)
Total Bilirubin: 0.7 mg/dL (ref 0.3–1.2)
Total Protein: 6.5 g/dL (ref 6.5–8.1)

## 2022-05-19 LAB — MAGNESIUM: Magnesium: 1.9 mg/dL (ref 1.7–2.4)

## 2022-05-19 MED ORDER — SODIUM CHLORIDE 0.9 % IV SOLN
67.5000 mg/m2 | Freq: Once | INTRAVENOUS | Status: AC
  Administered 2022-05-19: 111 mg via INTRAVENOUS
  Filled 2022-05-19: qty 11.1

## 2022-05-19 MED ORDER — PALONOSETRON HCL INJECTION 0.25 MG/5ML
0.2500 mg | Freq: Once | INTRAVENOUS | Status: AC
  Administered 2022-05-19: 0.25 mg via INTRAVENOUS
  Filled 2022-05-19: qty 5

## 2022-05-19 MED ORDER — SODIUM CHLORIDE 0.9% FLUSH
10.0000 mL | INTRAVENOUS | Status: DC | PRN
  Administered 2022-05-19: 10 mL

## 2022-05-19 MED ORDER — SODIUM CHLORIDE 0.9% FLUSH
10.0000 mL | Freq: Once | INTRAVENOUS | Status: AC
  Administered 2022-05-19: 10 mL via INTRAVENOUS

## 2022-05-19 MED ORDER — SODIUM CHLORIDE 0.9 % IV SOLN
10.0000 mg | Freq: Once | INTRAVENOUS | Status: AC
  Administered 2022-05-19: 10 mg via INTRAVENOUS
  Filled 2022-05-19: qty 10

## 2022-05-19 MED ORDER — SODIUM CHLORIDE 0.9 % IV SOLN: Freq: Once | INTRAVENOUS | Status: AC

## 2022-05-19 MED ORDER — HEPARIN SOD (PORK) LOCK FLUSH 100 UNIT/ML IV SOLN
500.0000 [IU] | Freq: Once | INTRAVENOUS | Status: AC | PRN
  Administered 2022-05-19: 500 [IU]

## 2022-05-19 MED ORDER — SODIUM CHLORIDE 0.9 % IV SOLN
540.0000 mg/m2 | Freq: Once | INTRAVENOUS | Status: AC
  Administered 2022-05-19: 900 mg via INTRAVENOUS
  Filled 2022-05-19: qty 45

## 2022-05-19 NOTE — Patient Instructions (Addendum)
[facility] at [facility] Discharge Instructions   You were seen and examined today by Dr. [name] Saba.  He reviewed the results of your lab work which are normal/stable.   We will proceed with your treatment today.   We will make a referral to radiation oncology for consultation.   Return as scheduled.    Thank you for choosing [facility] at [facility] to provide your oncology and hematology care.  To afford each patient quality time with our provider, please arrive at least 15 minutes before your scheduled appointment time.   If you have a lab appointment with the Cancer Center please come in thru the Main Entrance and check in at the main information desk.  You need to re-schedule your appointment should you arrive 10 or more minutes late.  We strive to give you quality time with our providers, and arriving late affects you and other patients whose appointments are after yours.  Also, if you no show three or more times for appointments you may be dismissed from the clinic at the providers discretion.     Again, thank you for choosing [facility].  Our hope is that these requests will decrease the amount of time that you wait before being seen by our physicians.       _____________________________________________________________  Should you have questions after your visit to [facility], please contact our office at [phone] and follow the prompts.  Our office hours are 8:00 a.m. and 4:30 p.m. Monday - Friday.  Please note that voicemails left after 4:00 p.m. may not be returned until the following business day.  We are closed weekends and major holidays.  You do have access to a nurse 24-7, just call the main number to the clinic [phone] and do not press any options, hold on the line and a nurse will answer the phone.    For prescription refill requests, have your pharmacy contact our office and allow 72  hours.    Due to Covid, you will need to wear a mask upon entering the hospital. If you do not have a mask, a mask will be given to you at the Main Entrance upon arrival. For doctor visits, patients may have 1 support person age 73 or older with them. For treatment visits, patients can not have anyone with them due to social distancing guidelines and our immunocompromised population.

## 2022-05-19 NOTE — Patient Instructions (Signed)
[facility]  Discharge Instructions: Thank you for choosing [facility] to provide your oncology and hematology care.  If you have a lab appointment with the Cancer Center - please note that after April 8th, 2024, all labs will be drawn in the cancer center.  You do not have to check in or register with the main entrance as you have in the past but will complete your check-in in the cancer center.  Wear comfortable clothing and clothing appropriate for easy access to any Portacath or PICC line.   We strive to give you quality time with your provider. You may need to reschedule your appointment if you arrive late (15 or more minutes).  Arriving late affects you and other patients whose appointments are after yours.  Also, if you miss three or more appointments without notifying the office, you may be dismissed from the clinic at the provider's discretion.      For prescription refill requests, have your pharmacy contact our office and allow 72 hours for refills to be completed.    Today you received the following chemotherapy and/or immunotherapy agents Taxotere/Cytoxan      To help prevent nausea and vomiting after your treatment, we encourage you to take your nausea medication as directed.  BELOW ARE SYMPTOMS THAT SHOULD BE REPORTED IMMEDIATELY: *FEVER GREATER THAN 100.4 F (38 C) OR HIGHER *CHILLS OR SWEATING *NAUSEA AND VOMITING THAT IS NOT CONTROLLED WITH YOUR NAUSEA MEDICATION *UNUSUAL SHORTNESS OF BREATH *UNUSUAL BRUISING OR BLEEDING *URINARY PROBLEMS (pain or burning when urinating, or frequent urination) *BOWEL PROBLEMS (unusual diarrhea, constipation, pain near the anus) TENDERNESS IN MOUTH AND THROAT WITH OR WITHOUT PRESENCE OF ULCERS (sore throat, sores in mouth, or a toothache) UNUSUAL RASH, SWELLING OR PAIN  UNUSUAL VAGINAL DISCHARGE OR ITCHING   Items with * indicate a potential emergency and should be followed up as soon as possible or go  to the Emergency Department if any problems should occur.  Please show the CHEMOTHERAPY ALERT CARD or IMMUNOTHERAPY ALERT CARD at check-in to the Emergency Department and triage nurse.  Should you have questions after your visit or need to cancel or reschedule your appointment, please contact [facility] [phone]  and follow the prompts.  Office hours are 8:00 a.m. to 4:30 p.m. Monday - Friday. Please note that voicemails left after 4:00 p.m. may not be returned until the following business day.  We are closed weekends and major holidays. You have access to a nurse at all times for urgent questions. Please call the main number to the clinic [phone] and follow the prompts.  For any non-urgent questions, you may also contact your provider using MyChart. We now offer e-Visits for anyone 5 and older to request care online for non-urgent symptoms. For details visit [email or web address].   Also download the MyChart app! Go to the app store, search "MyChart", open the app, select [place], and log in with your MyChart username and password.

## 2022-05-19 NOTE — Progress Notes (Signed)
Patient presents today for Taxotere and cytoxan infusion per providers order.  Vital signs and labs reviewed by MD.  Message received from Chapman Moss RN/Dr. [name] Saba patient okay for treatment.  Treatment given today per MD orders.  Stable during infusion without adverse affects.  Vital signs stable.  No complaints at this time.  Discharge from clinic ambulatory in stable condition.  Alert and oriented X 3.  Follow up with [facility] as scheduled.

## 2022-05-19 NOTE — Progress Notes (Signed)
Patient has been examined by Dr. Katragadda. Vital signs and labs have been reviewed by MD - ANC, Creatinine, LFTs, hemoglobin, and platelets are within treatment parameters per M.D. - pt may proceed with treatment.  Primary RN and pharmacy notified.  

## 2022-05-19 NOTE — Progress Notes (Signed)
Please continue dose of Taxotere and Cytoxan at 90% of full dose.  Doses adjusted to reflect above.  T.O. Dr. Katragadda/Rambo Sarafian, PharmD 

## 2022-05-20 ENCOUNTER — Other Ambulatory Visit: Payer: Self-pay

## 2022-05-21 ENCOUNTER — Inpatient Hospital Stay: Payer: Medicare PPO

## 2022-05-21 VITALS — BP 111/64 | HR 91 | Temp 97.0°F | Resp 18

## 2022-05-21 DIAGNOSIS — Z5111 Encounter for antineoplastic chemotherapy: Secondary | ICD-10-CM | POA: Diagnosis not present

## 2022-05-21 DIAGNOSIS — D696 Thrombocytopenia, unspecified: Secondary | ICD-10-CM | POA: Diagnosis not present

## 2022-05-21 DIAGNOSIS — C50411 Malignant neoplasm of upper-outer quadrant of right female breast: Secondary | ICD-10-CM | POA: Diagnosis not present

## 2022-05-21 DIAGNOSIS — Z5189 Encounter for other specified aftercare: Secondary | ICD-10-CM | POA: Diagnosis not present

## 2022-05-21 DIAGNOSIS — Z171 Estrogen receptor negative status [ER-]: Secondary | ICD-10-CM | POA: Diagnosis not present

## 2022-05-21 MED ORDER — PEGFILGRASTIM-CBQV 6 MG/0.6ML ~~LOC~~ SOSY
6.0000 mg | PREFILLED_SYRINGE | Freq: Once | SUBCUTANEOUS | Status: AC
  Administered 2022-05-21: 6 mg via SUBCUTANEOUS
  Filled 2022-05-21: qty 0.6

## 2022-05-21 NOTE — Patient Instructions (Signed)
[facility]  Discharge Instructions: Thank you for choosing [facility] to provide your oncology and hematology care.  If you have a lab appointment with the Cancer Center - please note that after April 8th, 2024, all labs will be drawn in the cancer center.  You do not have to check in or register with the main entrance as you have in the past but will complete your check-in in the cancer center.  Wear comfortable clothing and clothing appropriate for easy access to any Portacath or PICC line.   We strive to give you quality time with your provider. You may need to reschedule your appointment if you arrive late (15 or more minutes).  Arriving late affects you and other patients whose appointments are after yours.  Also, if you miss three or more appointments without notifying the office, you may be dismissed from the clinic at the provider's discretion.      For prescription refill requests, have your pharmacy contact our office and allow 72 hours for refills to be completed.    Today you received the following Pegfilgrastim-cbqv 6 return as scheduled.   To help prevent nausea and vomiting after your treatment, we encourage you to take your nausea medication as directed.  BELOW ARE SYMPTOMS THAT SHOULD BE REPORTED IMMEDIATELY: *FEVER GREATER THAN 100.4 F (38 C) OR HIGHER *CHILLS OR SWEATING *NAUSEA AND VOMITING THAT IS NOT CONTROLLED WITH YOUR NAUSEA MEDICATION *UNUSUAL SHORTNESS OF BREATH *UNUSUAL BRUISING OR BLEEDING *URINARY PROBLEMS (pain or burning when urinating, or frequent urination) *BOWEL PROBLEMS (unusual diarrhea, constipation, pain near the anus) TENDERNESS IN MOUTH AND THROAT WITH OR WITHOUT PRESENCE OF ULCERS (sore throat, sores in mouth, or a toothache) UNUSUAL RASH, SWELLING OR PAIN  UNUSUAL VAGINAL DISCHARGE OR ITCHING   Items with * indicate a potential emergency and should be followed up as soon as possible or go to the Emergency  Department if any problems should occur.  Please show the CHEMOTHERAPY ALERT CARD or IMMUNOTHERAPY ALERT CARD at check-in to the Emergency Department and triage nurse.  Should you have questions after your visit or need to cancel or reschedule your appointment, please contact [facility] CENTER AT [facility] [phone]  and follow the prompts.  Office hours are 8:00 a.m. to 4:30 p.m. Monday - Friday. Please note that voicemails left after 4:00 p.m. may not be returned until the following business day.  We are closed weekends and major holidays. You have access to a nurse at all times for urgent questions. Please call the main number to the clinic [phone] and follow the prompts.  For any non-urgent questions, you may also contact your provider using MyChart. We now offer e-Visits for anyone 46 and older to request care online for non-urgent symptoms. For details visit [email or web address].   Also download the MyChart app! Go to the app store, search "MyChart", open the app, select [place], and log in with your MyChart username and password.

## 2022-05-21 NOTE — Progress Notes (Signed)
Patient tolerated injection with no complaints voiced. Site clean and dry with no bruising or swelling noted at site. See MAR for details. Band aid applied.  Patient stable during and after injection. VSS with discharge and left in satisfactory condition with no s/s of distress noted.  

## 2022-06-11 DIAGNOSIS — Z171 Estrogen receptor negative status [ER-]: Secondary | ICD-10-CM | POA: Diagnosis not present

## 2022-06-11 DIAGNOSIS — C50411 Malignant neoplasm of upper-outer quadrant of right female breast: Secondary | ICD-10-CM | POA: Diagnosis not present

## 2022-06-12 DIAGNOSIS — Z510 Encounter for antineoplastic radiation therapy: Secondary | ICD-10-CM | POA: Diagnosis not present

## 2022-06-12 DIAGNOSIS — Z171 Estrogen receptor negative status [ER-]: Secondary | ICD-10-CM | POA: Diagnosis not present

## 2022-06-12 DIAGNOSIS — C50411 Malignant neoplasm of upper-outer quadrant of right female breast: Secondary | ICD-10-CM | POA: Diagnosis not present

## 2022-06-23 DIAGNOSIS — Z510 Encounter for antineoplastic radiation therapy: Secondary | ICD-10-CM | POA: Diagnosis not present

## 2022-06-23 DIAGNOSIS — Z171 Estrogen receptor negative status [ER-]: Secondary | ICD-10-CM | POA: Diagnosis not present

## 2022-06-23 DIAGNOSIS — C50411 Malignant neoplasm of upper-outer quadrant of right female breast: Secondary | ICD-10-CM | POA: Diagnosis not present

## 2022-06-24 DIAGNOSIS — C50411 Malignant neoplasm of upper-outer quadrant of right female breast: Secondary | ICD-10-CM | POA: Diagnosis not present

## 2022-06-24 DIAGNOSIS — Z510 Encounter for antineoplastic radiation therapy: Secondary | ICD-10-CM | POA: Diagnosis not present

## 2022-06-24 DIAGNOSIS — Z171 Estrogen receptor negative status [ER-]: Secondary | ICD-10-CM | POA: Diagnosis not present

## 2022-06-25 DIAGNOSIS — C50411 Malignant neoplasm of upper-outer quadrant of right female breast: Secondary | ICD-10-CM | POA: Diagnosis not present

## 2022-06-25 DIAGNOSIS — Z510 Encounter for antineoplastic radiation therapy: Secondary | ICD-10-CM | POA: Diagnosis not present

## 2022-06-25 DIAGNOSIS — Z171 Estrogen receptor negative status [ER-]: Secondary | ICD-10-CM | POA: Diagnosis not present

## 2022-06-26 DIAGNOSIS — Z510 Encounter for antineoplastic radiation therapy: Secondary | ICD-10-CM | POA: Diagnosis not present

## 2022-06-26 DIAGNOSIS — C50411 Malignant neoplasm of upper-outer quadrant of right female breast: Secondary | ICD-10-CM | POA: Diagnosis not present

## 2022-06-26 DIAGNOSIS — Z171 Estrogen receptor negative status [ER-]: Secondary | ICD-10-CM | POA: Diagnosis not present

## 2022-06-27 DIAGNOSIS — Z171 Estrogen receptor negative status [ER-]: Secondary | ICD-10-CM | POA: Diagnosis not present

## 2022-06-27 DIAGNOSIS — Z510 Encounter for antineoplastic radiation therapy: Secondary | ICD-10-CM | POA: Diagnosis not present

## 2022-06-27 DIAGNOSIS — C50411 Malignant neoplasm of upper-outer quadrant of right female breast: Secondary | ICD-10-CM | POA: Diagnosis not present

## 2022-06-30 DIAGNOSIS — Z510 Encounter for antineoplastic radiation therapy: Secondary | ICD-10-CM | POA: Diagnosis not present

## 2022-06-30 DIAGNOSIS — Z171 Estrogen receptor negative status [ER-]: Secondary | ICD-10-CM | POA: Diagnosis not present

## 2022-06-30 DIAGNOSIS — C50411 Malignant neoplasm of upper-outer quadrant of right female breast: Secondary | ICD-10-CM | POA: Diagnosis not present

## 2022-07-01 DIAGNOSIS — Z510 Encounter for antineoplastic radiation therapy: Secondary | ICD-10-CM | POA: Diagnosis not present

## 2022-07-01 DIAGNOSIS — Z171 Estrogen receptor negative status [ER-]: Secondary | ICD-10-CM | POA: Diagnosis not present

## 2022-07-01 DIAGNOSIS — C50411 Malignant neoplasm of upper-outer quadrant of right female breast: Secondary | ICD-10-CM | POA: Diagnosis not present

## 2022-07-02 DIAGNOSIS — Z510 Encounter for antineoplastic radiation therapy: Secondary | ICD-10-CM | POA: Diagnosis not present

## 2022-07-02 DIAGNOSIS — E039 Hypothyroidism, unspecified: Secondary | ICD-10-CM | POA: Diagnosis not present

## 2022-07-02 DIAGNOSIS — K219 Gastro-esophageal reflux disease without esophagitis: Secondary | ICD-10-CM | POA: Diagnosis not present

## 2022-07-02 DIAGNOSIS — E559 Vitamin D deficiency, unspecified: Secondary | ICD-10-CM | POA: Diagnosis not present

## 2022-07-02 DIAGNOSIS — R7303 Prediabetes: Secondary | ICD-10-CM | POA: Diagnosis not present

## 2022-07-02 DIAGNOSIS — I10 Essential (primary) hypertension: Secondary | ICD-10-CM | POA: Diagnosis not present

## 2022-07-02 DIAGNOSIS — Z0001 Encounter for general adult medical examination with abnormal findings: Secondary | ICD-10-CM | POA: Diagnosis not present

## 2022-07-02 DIAGNOSIS — E7801 Familial hypercholesterolemia: Secondary | ICD-10-CM | POA: Diagnosis not present

## 2022-07-02 DIAGNOSIS — Z171 Estrogen receptor negative status [ER-]: Secondary | ICD-10-CM | POA: Diagnosis not present

## 2022-07-02 DIAGNOSIS — C50411 Malignant neoplasm of upper-outer quadrant of right female breast: Secondary | ICD-10-CM | POA: Diagnosis not present

## 2022-07-03 DIAGNOSIS — Z510 Encounter for antineoplastic radiation therapy: Secondary | ICD-10-CM | POA: Diagnosis not present

## 2022-07-03 DIAGNOSIS — C50411 Malignant neoplasm of upper-outer quadrant of right female breast: Secondary | ICD-10-CM | POA: Diagnosis not present

## 2022-07-03 DIAGNOSIS — Z171 Estrogen receptor negative status [ER-]: Secondary | ICD-10-CM | POA: Diagnosis not present

## 2022-07-04 DIAGNOSIS — Z171 Estrogen receptor negative status [ER-]: Secondary | ICD-10-CM | POA: Diagnosis not present

## 2022-07-04 DIAGNOSIS — C50411 Malignant neoplasm of upper-outer quadrant of right female breast: Secondary | ICD-10-CM | POA: Diagnosis not present

## 2022-07-04 DIAGNOSIS — Z510 Encounter for antineoplastic radiation therapy: Secondary | ICD-10-CM | POA: Diagnosis not present

## 2022-07-07 DIAGNOSIS — Z6824 Body mass index (BMI) 24.0-24.9, adult: Secondary | ICD-10-CM | POA: Diagnosis not present

## 2022-07-07 DIAGNOSIS — C50411 Malignant neoplasm of upper-outer quadrant of right female breast: Secondary | ICD-10-CM | POA: Diagnosis not present

## 2022-07-07 DIAGNOSIS — I10 Essential (primary) hypertension: Secondary | ICD-10-CM | POA: Diagnosis not present

## 2022-07-07 DIAGNOSIS — E7801 Familial hypercholesterolemia: Secondary | ICD-10-CM | POA: Diagnosis not present

## 2022-07-07 DIAGNOSIS — Z0001 Encounter for general adult medical examination with abnormal findings: Secondary | ICD-10-CM | POA: Diagnosis not present

## 2022-07-07 DIAGNOSIS — Z171 Estrogen receptor negative status [ER-]: Secondary | ICD-10-CM | POA: Diagnosis not present

## 2022-07-07 DIAGNOSIS — R7303 Prediabetes: Secondary | ICD-10-CM | POA: Diagnosis not present

## 2022-07-07 DIAGNOSIS — Z853 Personal history of malignant neoplasm of breast: Secondary | ICD-10-CM | POA: Diagnosis not present

## 2022-07-07 DIAGNOSIS — D696 Thrombocytopenia, unspecified: Secondary | ICD-10-CM | POA: Diagnosis not present

## 2022-07-07 DIAGNOSIS — K219 Gastro-esophageal reflux disease without esophagitis: Secondary | ICD-10-CM | POA: Diagnosis not present

## 2022-07-07 DIAGNOSIS — Z510 Encounter for antineoplastic radiation therapy: Secondary | ICD-10-CM | POA: Diagnosis not present

## 2022-07-08 DIAGNOSIS — C50411 Malignant neoplasm of upper-outer quadrant of right female breast: Secondary | ICD-10-CM | POA: Diagnosis not present

## 2022-07-08 DIAGNOSIS — Z171 Estrogen receptor negative status [ER-]: Secondary | ICD-10-CM | POA: Diagnosis not present

## 2022-07-08 DIAGNOSIS — Z510 Encounter for antineoplastic radiation therapy: Secondary | ICD-10-CM | POA: Diagnosis not present

## 2022-07-09 DIAGNOSIS — Z171 Estrogen receptor negative status [ER-]: Secondary | ICD-10-CM | POA: Diagnosis not present

## 2022-07-09 DIAGNOSIS — C50411 Malignant neoplasm of upper-outer quadrant of right female breast: Secondary | ICD-10-CM | POA: Diagnosis not present

## 2022-07-09 DIAGNOSIS — Z510 Encounter for antineoplastic radiation therapy: Secondary | ICD-10-CM | POA: Diagnosis not present

## 2022-07-11 DIAGNOSIS — Z171 Estrogen receptor negative status [ER-]: Secondary | ICD-10-CM | POA: Diagnosis not present

## 2022-07-11 DIAGNOSIS — Z510 Encounter for antineoplastic radiation therapy: Secondary | ICD-10-CM | POA: Diagnosis not present

## 2022-07-11 DIAGNOSIS — C50411 Malignant neoplasm of upper-outer quadrant of right female breast: Secondary | ICD-10-CM | POA: Diagnosis not present

## 2022-07-14 DIAGNOSIS — Z510 Encounter for antineoplastic radiation therapy: Secondary | ICD-10-CM | POA: Diagnosis not present

## 2022-07-14 DIAGNOSIS — Z171 Estrogen receptor negative status [ER-]: Secondary | ICD-10-CM | POA: Diagnosis not present

## 2022-07-14 DIAGNOSIS — C50411 Malignant neoplasm of upper-outer quadrant of right female breast: Secondary | ICD-10-CM | POA: Diagnosis not present

## 2022-07-15 DIAGNOSIS — Z171 Estrogen receptor negative status [ER-]: Secondary | ICD-10-CM | POA: Diagnosis not present

## 2022-07-15 DIAGNOSIS — C50411 Malignant neoplasm of upper-outer quadrant of right female breast: Secondary | ICD-10-CM | POA: Diagnosis not present

## 2022-07-15 DIAGNOSIS — Z510 Encounter for antineoplastic radiation therapy: Secondary | ICD-10-CM | POA: Diagnosis not present

## 2022-07-16 DIAGNOSIS — C50411 Malignant neoplasm of upper-outer quadrant of right female breast: Secondary | ICD-10-CM | POA: Diagnosis not present

## 2022-07-16 DIAGNOSIS — Z171 Estrogen receptor negative status [ER-]: Secondary | ICD-10-CM | POA: Diagnosis not present

## 2022-07-16 DIAGNOSIS — Z510 Encounter for antineoplastic radiation therapy: Secondary | ICD-10-CM | POA: Diagnosis not present

## 2022-07-17 DIAGNOSIS — C50411 Malignant neoplasm of upper-outer quadrant of right female breast: Secondary | ICD-10-CM | POA: Diagnosis not present

## 2022-07-17 DIAGNOSIS — Z510 Encounter for antineoplastic radiation therapy: Secondary | ICD-10-CM | POA: Diagnosis not present

## 2022-07-17 DIAGNOSIS — Z171 Estrogen receptor negative status [ER-]: Secondary | ICD-10-CM | POA: Diagnosis not present

## 2022-07-18 DIAGNOSIS — Z510 Encounter for antineoplastic radiation therapy: Secondary | ICD-10-CM | POA: Diagnosis not present

## 2022-07-18 DIAGNOSIS — C50411 Malignant neoplasm of upper-outer quadrant of right female breast: Secondary | ICD-10-CM | POA: Diagnosis not present

## 2022-07-18 DIAGNOSIS — Z171 Estrogen receptor negative status [ER-]: Secondary | ICD-10-CM | POA: Diagnosis not present

## 2022-07-21 DIAGNOSIS — Z510 Encounter for antineoplastic radiation therapy: Secondary | ICD-10-CM | POA: Diagnosis not present

## 2022-07-21 DIAGNOSIS — Z171 Estrogen receptor negative status [ER-]: Secondary | ICD-10-CM | POA: Diagnosis not present

## 2022-07-21 DIAGNOSIS — C50411 Malignant neoplasm of upper-outer quadrant of right female breast: Secondary | ICD-10-CM | POA: Diagnosis not present

## 2022-07-22 DIAGNOSIS — Z171 Estrogen receptor negative status [ER-]: Secondary | ICD-10-CM | POA: Diagnosis not present

## 2022-07-22 DIAGNOSIS — C50411 Malignant neoplasm of upper-outer quadrant of right female breast: Secondary | ICD-10-CM | POA: Diagnosis not present

## 2022-07-22 DIAGNOSIS — Z510 Encounter for antineoplastic radiation therapy: Secondary | ICD-10-CM | POA: Diagnosis not present

## 2022-07-23 DIAGNOSIS — Z510 Encounter for antineoplastic radiation therapy: Secondary | ICD-10-CM | POA: Diagnosis not present

## 2022-07-23 DIAGNOSIS — Z171 Estrogen receptor negative status [ER-]: Secondary | ICD-10-CM | POA: Diagnosis not present

## 2022-07-23 DIAGNOSIS — C50411 Malignant neoplasm of upper-outer quadrant of right female breast: Secondary | ICD-10-CM | POA: Diagnosis not present

## 2022-07-24 DIAGNOSIS — C50411 Malignant neoplasm of upper-outer quadrant of right female breast: Secondary | ICD-10-CM | POA: Diagnosis not present

## 2022-07-24 DIAGNOSIS — Z171 Estrogen receptor negative status [ER-]: Secondary | ICD-10-CM | POA: Diagnosis not present

## 2022-07-24 DIAGNOSIS — Z510 Encounter for antineoplastic radiation therapy: Secondary | ICD-10-CM | POA: Diagnosis not present

## 2022-08-05 ENCOUNTER — Other Ambulatory Visit: Payer: Self-pay

## 2022-08-07 DIAGNOSIS — Z6824 Body mass index (BMI) 24.0-24.9, adult: Secondary | ICD-10-CM | POA: Diagnosis not present

## 2022-08-07 DIAGNOSIS — I10 Essential (primary) hypertension: Secondary | ICD-10-CM | POA: Diagnosis not present

## 2022-08-07 DIAGNOSIS — Z0001 Encounter for general adult medical examination with abnormal findings: Secondary | ICD-10-CM | POA: Diagnosis not present

## 2022-08-07 DIAGNOSIS — E7800 Pure hypercholesterolemia, unspecified: Secondary | ICD-10-CM | POA: Diagnosis not present

## 2022-08-12 ENCOUNTER — Inpatient Hospital Stay: Payer: Medicare PPO

## 2022-08-19 ENCOUNTER — Inpatient Hospital Stay: Payer: Medicare PPO | Admitting: Hematology

## 2022-08-26 ENCOUNTER — Inpatient Hospital Stay: Payer: Medicare PPO | Attending: Hematology

## 2022-08-26 ENCOUNTER — Inpatient Hospital Stay: Payer: Medicare PPO

## 2022-08-26 VITALS — BP 118/73 | HR 89 | Temp 98.3°F | Resp 18 | Wt 132.4 lb

## 2022-08-26 DIAGNOSIS — Z171 Estrogen receptor negative status [ER-]: Secondary | ICD-10-CM | POA: Diagnosis not present

## 2022-08-26 DIAGNOSIS — C50411 Malignant neoplasm of upper-outer quadrant of right female breast: Secondary | ICD-10-CM | POA: Insufficient documentation

## 2022-08-26 LAB — COMPREHENSIVE METABOLIC PANEL WITH GFR
ALT: 16 U/L (ref 0–44)
AST: 16 U/L (ref 15–41)
Albumin: 4 g/dL (ref 3.5–5.0)
Alkaline Phosphatase: 43 U/L (ref 38–126)
Anion gap: 10 (ref 5–15)
BUN: 14 mg/dL (ref 8–23)
CO2: 24 mmol/L (ref 22–32)
Calcium: 9 mg/dL (ref 8.9–10.3)
Chloride: 105 mmol/L (ref 98–111)
Creatinine, Ser: 0.75 mg/dL (ref 0.44–1.00)
GFR, Estimated: >60 mL/min
Glucose, Bld: 135 mg/dL — ABNORMAL HIGH (ref 70–99)
Potassium: 3.3 mmol/L — ABNORMAL LOW (ref 3.5–5.1)
Sodium: 139 mmol/L (ref 135–145)
Total Bilirubin: 0.7 mg/dL (ref 0.3–1.2)
Total Protein: 6.6 g/dL (ref 6.5–8.1)

## 2022-08-26 LAB — CBC WITH DIFFERENTIAL/PLATELET
Abs Immature Granulocytes: 0.01 10*3/uL (ref 0.00–0.07)
Basophils Absolute: 0 10*3/uL (ref 0.0–0.1)
Basophils Relative: 1 %
Eosinophils Absolute: 0.1 10*3/uL (ref 0.0–0.5)
Eosinophils Relative: 1 %
HCT: 39.2 % (ref 36.0–46.0)
Hemoglobin: 13.5 g/dL (ref 12.0–15.0)
Immature Granulocytes: 0 %
Lymphocytes Relative: 17 %
Lymphs Abs: 0.9 10*3/uL (ref 0.7–4.0)
MCH: 31.1 pg (ref 26.0–34.0)
MCHC: 34.4 g/dL (ref 30.0–36.0)
MCV: 90.3 fL (ref 80.0–100.0)
Monocytes Absolute: 0.4 10*3/uL (ref 0.1–1.0)
Monocytes Relative: 7 %
Neutro Abs: 4.2 10*3/uL (ref 1.7–7.7)
Neutrophils Relative %: 74 %
Platelets: 155 10*3/uL (ref 150–400)
RBC: 4.34 MIL/uL (ref 3.87–5.11)
RDW: 12.4 % (ref 11.5–15.5)
WBC: 5.6 10*3/uL (ref 4.0–10.5)
nRBC: 0 % (ref 0.0–0.2)

## 2022-08-26 MED ORDER — HEPARIN SOD (PORK) LOCK FLUSH 100 UNIT/ML IV SOLN
500.0000 [IU] | Freq: Once | INTRAVENOUS | Status: AC
  Administered 2022-08-26: 500 [IU] via INTRAVENOUS

## 2022-08-26 MED ORDER — SODIUM CHLORIDE 0.9% FLUSH
10.0000 mL | Freq: Once | INTRAVENOUS | Status: AC
  Administered 2022-08-26: 10 mL via INTRAVENOUS

## 2022-08-27 LAB — CANCER ANTIGEN 15-3: CA 15-3: 4.2 U/mL (ref 0.0–25.0)

## 2022-09-01 NOTE — Progress Notes (Signed)
[facility] [address] [place] [address]    Clinic Day:  09/01/2022  Referring physician: Donetta Potts, MD  Patient Care Team: Donetta Potts, MD as PCP - General (Internal Medicine) Doreatha Massed, MD as Medical Oncologist (Medical Oncology) Therese Sarah, RN as Oncology Nurse Navigator (Medical Oncology)   ASSESSMENT & PLAN:   Assessment: 1.  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  Social/family history: - Lives at home with her husband.  Retired Runner, broadcasting/film/video.  Non-smoker. - Mother died of AML at age 32.  Maternal aunt had AML.  Maternal grandmother had leukemia, unknown type.  Maternal uncle had chronic leukemia.  Paternal aunt had bladder cancer.  Paternal first cousin had breast cancer.  Her son was diagnosed with GBM at age 63.    Plan: 1.  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  She received 90% dose of TC at last cycle.  She felt tired for few days.  No major GI side effects. - Reviewed labs today: Normal LFTs and creatinine.  CBC was grossly normal with mild thrombocytopenia.  She denies any numbness or tingling. - Recommend proceeding with cycle 4 at the same dose. - Will make referral to radiation oncology. - RTC 3 months for follow-up with repeat labs and tumor marker.    No orders of the defined types were placed in this  encounter.     Angel Barry,acting as a Neurosurgeon for Doreatha Massed, MD.,have documented all relevant documentation on the behalf of Doreatha Massed, MD,as directed by  Doreatha Massed, MD while in the presence of Doreatha Massed, MD.  ***   [name]   8/26/20249:04 PM  CHIEF COMPLAINT:   Diagnosis: Triple negative right breast cancer    Cancer Staging  Carcinoma of upper-outer quadrant of right breast in female, estrogen receptor negative (HCC) Staging form: Breast, AJCC 8th Edition - Clinical stage from 03/04/2022: Stage IB (cT1c, cN0, cM0, G3, ER-, PR-, HER2-) - Signed by Doreatha Massed, MD on 03/04/2022    Prior Therapy: right lumpectomy and SLNB on 02/05/2022   Current Therapy:  Docetaxel and cyclophosphamide    HISTORY OF PRESENT ILLNESS:   Oncology History  Carcinoma of upper-outer quadrant of right breast in female, estrogen receptor negative (HCC)  01/23/2022 Initial Diagnosis   Carcinoma of upper-outer quadrant of right breast in female, estrogen receptor negative (HCC)   03/04/2022 Cancer Staging   Staging form: Breast, AJCC 8th Edition - Clinical stage from 03/04/2022: Stage IB (cT1c, cN0, cM0, G3, ER-, PR-, HER2-) - Signed by Doreatha Massed, MD on 03/04/2022 Histopathologic type: Infiltrating duct carcinoma, NOS Stage prefix: Initial diagnosis Nuclear grade: G3 Histologic grading system: 3 grade system   03/17/2022 -  Chemotherapy   Patient is on Treatment Plan : BREAST TC q21d     04/21/2022 Genetic Testing   Negative genetic testing on the Custom  Cancer Panel.  The report date is April 21, 2022.  The Custom-Cancer offered by [facility] includes sequencing and/or deletion/duplication analysis of the following 125 genes:  ADA, AIP, ALK, ANKRD26*, APC*, ATM*, AXIN2, BAP1, BARD1, BLM, BMPR1A, BRCA1, BRCA2, BRIP1, CARD11, CARMIL2, CASP8, CBL, CD27, CDC73, CDH1, CDK4, CDKN1B, CDKN2A (p14ARF), CDKN2A (p16INK4a), CEBPA, CHEK2, CTLA4,  CTNNA1, CTPS1, DDX41, DICER1*, DOCK8, EGFR, ELANE, EPCAM*, ERCC6L2, ETV6, FADD, FAS, FASLG, FCHO1, FH*, FLCN, G6PC3, GATA2, GFI1*, GREM1*, HAX1, HOXB13, IKZF1, IL2RA, IL2RB, ITK, KIT, KRAS, LZTR1, MAGT1, MAX*, MBD4, MCM4, MECOM, MEN1*, MET*, MITF, MLH1*, MSH2*, MSH3*, MSH6*, MUTYH, NBN, NF1*, NF2, NTHL1, PALB2, PDGFRA, PIK3CD, PIK3R1, PMS2*, POLD1*, POLE, POT1, PRKAR1A, PRKCD, PTCH1, PTEN*, PTPN11, RAC2, RAD51C, RAD51D, RASGRP1, RB1*, RET, RHOH, RMRP, RTEL1, RUNX1, SAMD9, SAMD9L, SDHA*, SDHAF2, SDHB, SDHC*, SDHD, SH2D1A, SMAD4, SMARCA4, SMARCB1, SMARCE1, SRP72, STAT3, STK11, STK4, STXBP2, SUFU, TERC, TERT, TMEM127, TNFRSF13B, TP53, TPP2, TSC1*, TSC2, VHL, WAS, and XIAP.       INTERVAL HISTORY:   Angel Barry is a 77 y.o. female presenting to clinic today for follow up of Triple negative right breast cancer. She was last seen by me on 05/19/22.  Today, she states that she is doing well overall. Her appetite level is at ***%. Her energy level is at ***%.  PAST MEDICAL HISTORY:   Past Medical History: Past Medical History:  Diagnosis Date   Arthritis    Family history of breast cancer    Family history of kidney cancer    Family history of leukemia    GERD (gastroesophageal reflux disease)    Hypercholesteremia    Hypertension    Pre-diabetes     Surgical History: Past Surgical History:  Procedure Laterality Date   BREAST BIOPSY Right 01/14/2022   [place] RT BREAST BX W LOC DEV 1ST LESION IMG BX SPEC US GUIDE 01/14/2022 Edwin Cap, MD [facility]-UL[facility]AS[facility]   CATARACT EXTRACTION W/PHACO Left 12/18/2016   Procedure: CATARACT EXTRACTION PHACO AND INTRAOCULAR LENS PLACEMENT (IOC);  Surgeon: Fabio Pierce, MD;  Location: [facility];  Service: Ophthalmology;  Laterality: Left;  CDE: 9.55   CATARACT EXTRACTION W/PHACO Right 08/06/2018   Procedure: CATARACT EXTRACTION PHACO AND INTRAOCULAR LENS PLACEMENT (IOC);  Surgeon: Fabio Pierce, MD;  Location: [facility];  Service: Ophthalmology;  Laterality: Right;  CDE:  13.46   PARTIAL MASTECTOMY WITH AXILLARY SENTINEL LYMPH NODE BIOPSY Right 02/05/2022   PORTACATH PLACEMENT Left 03/14/2022   Procedure: INSERTION PORT-A-CATH;  Surgeon: Lucretia Roers, MD;  Location: [facility];  Service: General;  Laterality: Left;   TUBAL LIGATION      Social History: Social History   Socioeconomic History   Marital status: Married    Spouse name: Not on file   Number of children: Not on file   Years of education: Not on file   Highest education level: Not on file  Occupational History   Not on file  Tobacco Use   Smoking status: Never   Smokeless tobacco: Never  Vaping Use   Vaping status: Never Used  Substance and Sexual Activity   Alcohol use: Yes    Comment: very rarely   Drug use: No   Sexual activity: Yes    Birth control/protection: Post-menopausal  Other Topics Concern   Not on file  Social History Narrative   Not on file   Social Determinants of Health   Financial Resource Strain: Low Risk  (10/08/2020)   Received from [facility], [facility]   Overall Financial Resource Strain (CARDIA)    Difficulty of Paying Living Expenses:  Not hard at all  Food Insecurity: No Food Insecurity (03/05/2022)   Hunger Vital Sign    Worried About Running Out of Food in the Last Year: Never true    Ran Out of Food in the Last Year: Never true  Transportation Needs: No Transportation Needs (03/05/2022)   PRAPARE - Administrator, Civil Service (Medical): No    Lack of Transportation (Non-Medical): No  Physical Activity: Sufficiently Active (10/08/2020)   Received from [facility], [facility]   Exercise Vital Sign    Days of Exercise per Week: 4 days    Minutes of Exercise per Session: 50 min  Stress: No Stress Concern Present (10/08/2020)   Received from [facility], [facility]ational [facility] - Occupational Stress Questionnaire    Feeling of Stress : Only a little  Social Connections: Unknown (05/20/2021)    Received from [facility], [facility]   Social Network    Social Network: Not on file  Intimate Partner Violence: Not At Risk (03/05/2022)   Humiliation, Afraid, Rape, and Kick questionnaire    Fear of Current or Ex-Partner: No    Emotionally Abused: No    Physically Abused: No    Sexually Abused: No    Family History: Family History  Problem Relation Age of Onset   Atrial fibrillation Mother    Leukemia Mother 57       AML   Irregular heart beat Father    Leukemia Maternal Aunt 80       AML   Leukemia Maternal Uncle 72       CLL   Kidney cancer Paternal Aunt 84   Leukemia Maternal Grandmother 91   Stroke Paternal Grandmother    Alcoholism Paternal Grandfather    Brain cancer Son 63       Glioblastoma   Melanoma Cousin        Maternal first cousin   Breast cancer Cousin        paternal first cousin   Breast cancer Cousin        paternal first cousin's daughter    Current Medications:  Current Outpatient Medications:    acetaminophen (TYLENOL) 500 MG tablet, Take 500 mg by mouth every 6 (six) hours as needed for moderate pain., Disp: , Rfl:    ascorbic acid (VITAMIN C) 500 MG tablet, Take 500 mg by mouth in the morning., Disp: , Rfl:    cetirizine (ZYRTEC) 10 MG tablet, Take 10 mg by mouth daily as needed for allergies., Disp: , Rfl:    Cholecalciferol (VITAMIN D) 50 MCG (2000 UT) tablet, Take 2,000 Units by mouth in the morning., Disp: , Rfl:    CYCLOPHOSPHAMIDE IV, Inject into the vein every 21 ( twenty-one) days. (Patient not taking: Reported on 05/21/2022), Disp: , Rfl:    DOCETAXEL IV, Inject into the vein every 21 ( twenty-one) days. (Patient not taking: Reported on 05/21/2022), Disp: , Rfl:    ELDERBERRY PO, Take 10 mLs by mouth in the morning., Disp: , Rfl:    fluticasone (FLONASE) 50 MCG/ACT nasal spray, Place 2 sprays into both nostrils in the morning., Disp: , Rfl:    fosinopril-hydrochlorothiazide (MONOPRIL-HCT) 20-12.5 MG tablet, Take 1 tablet by mouth at  bedtime., Disp: , Rfl:    lidocaine-prilocaine (EMLA) cream, Apply 1 Application topically as needed. Apply a quarter-sized amount to port a cath site and cover with plastic wrap one hour prior to infusion appointments, Disp: 30 g, Rfl: 3  pantoprazole (PROTONIX) 40 MG tablet, Take 40 mg by mouth daily before breakfast., Disp: , Rfl:    polyethylene glycol (MIRALAX / GLYCOLAX) 17 g packet, Take 17 g by mouth at bedtime., Disp: , Rfl:    Polyethylene Glycol 400 (BLINK TEARS OP), Place 2 drops into both eyes daily as needed (dry/irritated eyes.)., Disp: , Rfl:    prochlorperazine (COMPAZINE) 10 MG tablet, Take 1 tablet (10 mg total) by mouth every 6 (six) hours as needed for nausea or vomiting., Disp: 60 tablet, Rfl: 3   simvastatin (ZOCOR) 10 MG tablet, Take 10 mg by mouth every evening., Disp: , Rfl:    Zinc 50 MG TABS, Take 50 mg by mouth in the morning., Disp: , Rfl:    Allergies: Allergies  Allergen Reactions   Macrobid [Nitrofurantoin Macrocrystal] Nausea And Vomiting    REVIEW OF SYSTEMS:   Review of Systems  Constitutional:  Negative for chills, fatigue and fever.  HENT:   Negative for lump/mass, mouth sores, nosebleeds, sore throat and trouble swallowing.   Eyes:  Negative for eye problems.  Respiratory:  Negative for cough and shortness of breath.   Cardiovascular:  Negative for chest pain, leg swelling and palpitations.  Gastrointestinal:  Negative for abdominal pain, constipation, diarrhea, nausea and vomiting.  Genitourinary:  Negative for bladder incontinence, difficulty urinating, dysuria, frequency, hematuria and nocturia.   Musculoskeletal:  Negative for arthralgias, back pain, flank pain, myalgias and neck pain.  Skin:  Negative for itching and rash.  Neurological:  Negative for dizziness, headaches and numbness.  Hematological:  Does not bruise/bleed easily.  Psychiatric/Behavioral:  Negative for depression, sleep disturbance and suicidal ideas. The patient is not  nervous/anxious.   All other systems reviewed and are negative.    VITALS:   There were no vitals taken for this visit.  Wt Readings from Last 3 Encounters:  08/26/22 132 lb 6.4 oz (60.1 kg)  05/19/22 135 lb 3.2 oz (61.3 kg)  04/28/22 136 lb (61.7 kg)    There is no height or weight on file to calculate BMI.  Performance status (ECOG): 1 - Symptomatic but completely ambulatory  PHYSICAL EXAM:   Physical Exam Vitals and nursing note reviewed. Exam conducted with a chaperone present.  Constitutional:      Appearance: Normal appearance.  Cardiovascular:     Rate and Rhythm: Normal rate and regular rhythm.     Pulses: Normal pulses.     Heart sounds: Normal heart sounds.  Pulmonary:     Effort: Pulmonary effort is normal.     Breath sounds: Normal breath sounds.  Abdominal:     Palpations: Abdomen is soft. There is no hepatomegaly, splenomegaly or mass.     Tenderness: There is no abdominal tenderness.  Musculoskeletal:     Right lower leg: No edema.     Left lower leg: No edema.  Lymphadenopathy:     Cervical: No cervical adenopathy.     Right cervical: No superficial, deep or posterior cervical adenopathy.    Left cervical: No superficial, deep or posterior cervical adenopathy.     Upper Body:     Right upper body: No supraclavicular or axillary adenopathy.     Left upper body: No supraclavicular or axillary adenopathy.  Neurological:     General: No focal deficit present.     Mental Status: She is alert and oriented to person, place, and time.  Psychiatric:        Mood and Affect: Mood normal.  Behavior: Behavior normal.     LABS:      Latest Ref Rng & Units 08/26/2022   12:12 PM 05/19/2022    8:07 AM 04/28/2022    9:01 AM  CBC  WBC 4.0 - 10.5 K/uL 5.6  7.9  5.9   Hemoglobin 12.0 - 15.0 g/dL 81.1  91.4  78.2   Hematocrit 36.0 - 46.0 % 39.2  34.6  36.0   Platelets 150 - 400 K/uL 155  147  150       Latest Ref Rng & Units 08/26/2022   12:12 PM  05/19/2022    8:07 AM 04/28/2022    9:01 AM  CMP  Glucose 70 - 99 mg/dL 956  213  086   BUN 8 - 23 mg/dL 14  10  15    Creatinine 0.44 - 1.00 mg/dL 5.78  4.69  6.29   Sodium 135 - 145 mmol/L 139  138  138   Potassium 3.5 - 5.1 mmol/L 3.3  3.6  3.7   Chloride 98 - 111 mmol/L 105  105  104   CO2 22 - 32 mmol/L 24  25  23    Calcium 8.9 - 10.3 mg/dL 9.0  8.8  8.9   Total Protein 6.5 - 8.1 g/dL 6.6  6.5  6.7   Total Bilirubin 0.3 - 1.2 mg/dL 0.7  0.7  0.6   Alkaline Phos 38 - 126 U/L 43  49  45   AST 15 - 41 U/L 16  16  16    ALT 0 - 44 U/L 16  15  14       No results found for: "CEA1", "CEA" / No results found for: "CEA1", "CEA" No results found for: "PSA1" No results found for: "BMW413" No results found for: "CAN125"  No results found for: "TOTALPROTELP", "ALBUMINELP", "A1GS", "A2GS", "BETS", "BETA2SER", "GAMS", "MSPIKE", "SPEI" No results found for: "TIBC", "FERRITIN", "IRONPCTSAT" No results found for: "LDH"   STUDIES:   No results found.

## 2022-09-02 ENCOUNTER — Other Ambulatory Visit (HOSPITAL_COMMUNITY): Payer: Self-pay | Admitting: Hematology

## 2022-09-02 ENCOUNTER — Inpatient Hospital Stay: Payer: Medicare PPO | Admitting: Hematology

## 2022-09-02 VITALS — BP 120/73 | HR 78 | Temp 97.9°F | Resp 18 | Wt 132.2 lb

## 2022-09-02 DIAGNOSIS — Z171 Estrogen receptor negative status [ER-]: Secondary | ICD-10-CM

## 2022-09-02 DIAGNOSIS — C50411 Malignant neoplasm of upper-outer quadrant of right female breast: Secondary | ICD-10-CM

## 2022-09-02 DIAGNOSIS — Z853 Personal history of malignant neoplasm of breast: Secondary | ICD-10-CM

## 2022-09-02 DIAGNOSIS — R928 Other abnormal and inconclusive findings on diagnostic imaging of breast: Secondary | ICD-10-CM

## 2022-09-02 NOTE — Patient Instructions (Addendum)
[facility] at [facility] Discharge Instructions   You were seen and examined today by Dr. [name] Saba.  He reviewed the results of your lab work which are normal/stable.   We will see you back in 6 months. We will repeat lab work and a mammogram prior to your next visit.   Return as scheduled.    Thank you for choosing [facility] at [facility] to provide your oncology and hematology care.  To afford each patient quality time with our provider, please arrive at least 15 minutes before your scheduled appointment time.   If you have a lab appointment with the Cancer Center please come in thru the Main Entrance and check in at the main information desk.  You need to re-schedule your appointment should you arrive 10 or more minutes late.  We strive to give you quality time with our providers, and arriving late affects you and other patients whose appointments are after yours.  Also, if you no show three or more times for appointments you may be dismissed from the clinic at the providers discretion.     Again, thank you for choosing [facility].  Our hope is that these requests will decrease the amount of time that you wait before being seen by our physicians.       _____________________________________________________________  Should you have questions after your visit to [facility], please contact our office at [phone] and follow the prompts.  Our office hours are 8:00 a.m. and 4:30 p.m. Monday - Friday.  Please note that voicemails left after 4:00 p.m. may not be returned until the following business day.  We are closed weekends and major holidays.  You do have access to a nurse 24-7, just call the main number to the clinic [phone] and do not press any options, hold on the line and a nurse will answer the phone.    For prescription refill requests, have your pharmacy contact our office and allow 72 hours.     Due to Covid, you will need to wear a mask upon entering the hospital. If you do not have a mask, a mask will be given to you at the Main Entrance upon arrival. For doctor visits, patients may have 1 support person age 58 or older with them. For treatment visits, patients can not have anyone with them due to social distancing guidelines and our immunocompromised population.

## 2022-09-03 ENCOUNTER — Other Ambulatory Visit: Payer: Self-pay

## 2022-09-05 DIAGNOSIS — C50411 Malignant neoplasm of upper-outer quadrant of right female breast: Secondary | ICD-10-CM | POA: Diagnosis not present

## 2022-09-05 DIAGNOSIS — Z510 Encounter for antineoplastic radiation therapy: Secondary | ICD-10-CM | POA: Diagnosis not present

## 2022-09-05 DIAGNOSIS — Z171 Estrogen receptor negative status [ER-]: Secondary | ICD-10-CM | POA: Diagnosis not present

## 2022-10-20 DIAGNOSIS — I10 Essential (primary) hypertension: Secondary | ICD-10-CM | POA: Diagnosis not present

## 2022-10-20 DIAGNOSIS — D696 Thrombocytopenia, unspecified: Secondary | ICD-10-CM | POA: Diagnosis not present

## 2022-12-02 ENCOUNTER — Inpatient Hospital Stay: Payer: Medicare PPO | Attending: Hematology

## 2022-12-02 DIAGNOSIS — Z171 Estrogen receptor negative status [ER-]: Secondary | ICD-10-CM | POA: Diagnosis not present

## 2022-12-02 DIAGNOSIS — C50411 Malignant neoplasm of upper-outer quadrant of right female breast: Secondary | ICD-10-CM | POA: Diagnosis not present

## 2022-12-02 DIAGNOSIS — Z452 Encounter for adjustment and management of vascular access device: Secondary | ICD-10-CM | POA: Insufficient documentation

## 2022-12-02 MED ORDER — HEPARIN SOD (PORK) LOCK FLUSH 100 UNIT/ML IV SOLN
500.0000 [IU] | Freq: Once | INTRAVENOUS | Status: AC
  Administered 2022-12-02: 500 [IU] via INTRAVENOUS

## 2022-12-02 MED ORDER — SODIUM CHLORIDE 0.9% FLUSH
10.0000 mL | Freq: Once | INTRAVENOUS | Status: AC
  Administered 2022-12-02: 10 mL via INTRAVENOUS

## 2022-12-02 NOTE — Progress Notes (Signed)
Patients port flushed without difficulty. Good blood return noted with no bruising or swelling noted at site. Band aid applied. VSS with discharge and left in satisfactory condition with no s/s of distress noted. All follow ups as scheduled.  Lamara Brecht [employer]

## 2023-01-06 ENCOUNTER — Ambulatory Visit (HOSPITAL_COMMUNITY): Payer: Medicare PPO

## 2023-01-06 ENCOUNTER — Encounter (HOSPITAL_COMMUNITY): Payer: Medicare PPO

## 2023-01-06 ENCOUNTER — Encounter (HOSPITAL_COMMUNITY): Payer: Self-pay

## 2023-01-15 ENCOUNTER — Ambulatory Visit (HOSPITAL_COMMUNITY)
Admission: RE | Admit: 2023-01-15 | Discharge: 2023-01-15 | Disposition: A | Discharge status: Home / Self Care | Payer: Medicare PPO | Source: Ambulatory Visit | Attending: Hematology | Admitting: Hematology

## 2023-01-15 ENCOUNTER — Encounter (HOSPITAL_COMMUNITY): Payer: Self-pay

## 2023-01-15 DIAGNOSIS — Z171 Estrogen receptor negative status [ER-]: Secondary | ICD-10-CM | POA: Insufficient documentation

## 2023-01-15 DIAGNOSIS — R928 Other abnormal and inconclusive findings on diagnostic imaging of breast: Secondary | ICD-10-CM

## 2023-01-15 DIAGNOSIS — Z853 Personal history of malignant neoplasm of breast: Secondary | ICD-10-CM | POA: Insufficient documentation

## 2023-01-15 DIAGNOSIS — C50411 Malignant neoplasm of upper-outer quadrant of right female breast: Secondary | ICD-10-CM | POA: Diagnosis not present

## 2023-01-15 DIAGNOSIS — R92323 Mammographic fibroglandular density, bilateral breasts: Secondary | ICD-10-CM | POA: Diagnosis not present

## 2023-02-09 DIAGNOSIS — I10 Essential (primary) hypertension: Secondary | ICD-10-CM | POA: Diagnosis not present

## 2023-02-09 DIAGNOSIS — Z1329 Encounter for screening for other suspected endocrine disorder: Secondary | ICD-10-CM | POA: Diagnosis not present

## 2023-02-09 DIAGNOSIS — K219 Gastro-esophageal reflux disease without esophagitis: Secondary | ICD-10-CM | POA: Diagnosis not present

## 2023-02-09 DIAGNOSIS — D696 Thrombocytopenia, unspecified: Secondary | ICD-10-CM | POA: Diagnosis not present

## 2023-02-09 DIAGNOSIS — R7303 Prediabetes: Secondary | ICD-10-CM | POA: Diagnosis not present

## 2023-02-19 DIAGNOSIS — E7800 Pure hypercholesterolemia, unspecified: Secondary | ICD-10-CM | POA: Diagnosis not present

## 2023-02-19 DIAGNOSIS — K219 Gastro-esophageal reflux disease without esophagitis: Secondary | ICD-10-CM | POA: Diagnosis not present

## 2023-02-19 DIAGNOSIS — R7303 Prediabetes: Secondary | ICD-10-CM | POA: Diagnosis not present

## 2023-02-19 DIAGNOSIS — I10 Essential (primary) hypertension: Secondary | ICD-10-CM | POA: Diagnosis not present

## 2023-02-19 DIAGNOSIS — Z6824 Body mass index (BMI) 24.0-24.9, adult: Secondary | ICD-10-CM | POA: Diagnosis not present

## 2023-02-24 ENCOUNTER — Inpatient Hospital Stay: Payer: Medicare PPO

## 2023-02-25 ENCOUNTER — Inpatient Hospital Stay: Payer: Medicare PPO

## 2023-03-02 ENCOUNTER — Inpatient Hospital Stay: Payer: Medicare PPO | Attending: Hematology

## 2023-03-02 DIAGNOSIS — Z171 Estrogen receptor negative status [ER-]: Secondary | ICD-10-CM | POA: Diagnosis not present

## 2023-03-02 DIAGNOSIS — C50411 Malignant neoplasm of upper-outer quadrant of right female breast: Secondary | ICD-10-CM | POA: Diagnosis not present

## 2023-03-02 LAB — CBC WITH DIFFERENTIAL/PLATELET
Abs Immature Granulocytes: 0.02 10*3/uL (ref 0.00–0.07)
Basophils Absolute: 0.1 10*3/uL (ref 0.0–0.1)
Basophils Relative: 1 %
Eosinophils Absolute: 0.1 10*3/uL (ref 0.0–0.5)
Eosinophils Relative: 1 %
HCT: 39.6 % (ref 36.0–46.0)
Hemoglobin: 13.3 g/dL (ref 12.0–15.0)
Immature Granulocytes: 0 %
Lymphocytes Relative: 25 %
Lymphs Abs: 1.4 10*3/uL (ref 0.7–4.0)
MCH: 31 pg (ref 26.0–34.0)
MCHC: 33.6 g/dL (ref 30.0–36.0)
MCV: 92.3 fL (ref 80.0–100.0)
Monocytes Absolute: 0.5 10*3/uL (ref 0.1–1.0)
Monocytes Relative: 8 %
Neutro Abs: 3.6 10*3/uL (ref 1.7–7.7)
Neutrophils Relative %: 65 %
Platelets: 117 10*3/uL — ABNORMAL LOW (ref 150–400)
RBC: 4.29 MIL/uL (ref 3.87–5.11)
RDW: 12.4 % (ref 11.5–15.5)
WBC: 5.7 10*3/uL (ref 4.0–10.5)
nRBC: 0 % (ref 0.0–0.2)

## 2023-03-02 LAB — COMPREHENSIVE METABOLIC PANEL
ALT: 16 U/L (ref 0–44)
AST: 18 U/L (ref 15–41)
Albumin: 4.1 g/dL (ref 3.5–5.0)
Alkaline Phosphatase: 47 U/L (ref 38–126)
Anion gap: 9 (ref 5–15)
BUN: 16 mg/dL (ref 8–23)
CO2: 24 mmol/L (ref 22–32)
Calcium: 9.2 mg/dL (ref 8.9–10.3)
Chloride: 104 mmol/L (ref 98–111)
Creatinine, Ser: 0.86 mg/dL (ref 0.44–1.00)
GFR, Estimated: >60 mL/min (ref >60)
Glucose, Bld: 101 mg/dL — ABNORMAL HIGH (ref 70–99)
Potassium: 3.7 mmol/L (ref 3.5–5.1)
Sodium: 137 mmol/L (ref 135–145)
Total Bilirubin: 0.7 mg/dL (ref 0.0–1.2)
Total Protein: 6.8 g/dL (ref 6.5–8.1)

## 2023-03-02 MED ORDER — SODIUM CHLORIDE 0.9% FLUSH
10.0000 mL | INTRAVENOUS | Status: DC | PRN
Start: 2023-03-02 — End: 2023-03-02
  Administered 2023-03-02: 10 mL via INTRAVENOUS

## 2023-03-02 MED ORDER — HEPARIN SOD (PORK) LOCK FLUSH 100 UNIT/ML IV SOLN
500.0000 [IU] | Freq: Once | INTRAVENOUS | Status: AC
Start: 2023-03-02 — End: 2023-03-02
  Administered 2023-03-02: 500 [IU] via INTRAVENOUS

## 2023-03-02 NOTE — Progress Notes (Signed)
 Angel Barry presented for Portacath access and flush. Proper placement of portacath confirmed by CXR. Portacath located left chest wall accessed with  H 20 needle. Good blood return present. Portacath flushed with 20ml NS and 500U/53ml Heparin and needle removed intact. Procedure without incident. Patient tolerated procedure well.

## 2023-03-02 NOTE — Patient Instructions (Signed)
 [facility] A DEPT OF [facility]  Discharge Instructions: Thank you for choosing [facility] to provide your oncology and hematology care.  If you have a lab appointment with the Cancer Center - please note that after April 8th, 2024, all labs will be drawn in the cancer center.  You do not have to check in or register with the main entrance as you have in the past but will complete your check-in in the cancer center.  Wear comfortable clothing and clothing appropriate for easy access to any Portacath or PICC line.   We strive to give you quality time with your provider. You may need to reschedule your appointment if you arrive late (15 or more minutes).  Arriving late affects you and other patients whose appointments are after yours.  Also, if you miss three or more appointments without notifying the office, you may be dismissed from the clinic at the provider's discretion.      For prescription refill requests, have your pharmacy contact our office and allow 72 hours for refills to be completed.    Today you received the following port flush with labs.   To help prevent nausea and vomiting after your treatment, we encourage you to take your nausea medication as directed.  BELOW ARE SYMPTOMS THAT SHOULD BE REPORTED IMMEDIATELY: *FEVER GREATER THAN 100.4 F (38 C) OR HIGHER *CHILLS OR SWEATING *NAUSEA AND VOMITING THAT IS NOT CONTROLLED WITH YOUR NAUSEA MEDICATION *UNUSUAL SHORTNESS OF BREATH *UNUSUAL BRUISING OR BLEEDING *URINARY PROBLEMS (pain or burning when urinating, or frequent urination) *BOWEL PROBLEMS (unusual diarrhea, constipation, pain near the anus) TENDERNESS IN MOUTH AND THROAT WITH OR WITHOUT PRESENCE OF ULCERS (sore throat, sores in mouth, or a toothache) UNUSUAL RASH, SWELLING OR PAIN  UNUSUAL VAGINAL DISCHARGE OR ITCHING   Items with * indicate a potential emergency and should be followed up as soon as possible or go to  the Emergency Department if any problems should occur.  Please show the CHEMOTHERAPY ALERT CARD or IMMUNOTHERAPY ALERT CARD at check-in to the Emergency Department and triage nurse.  Should you have questions after your visit or need to cancel or reschedule your appointment, please contact [facility] [place] - A DEPT OF [facility] [phone]  and follow the prompts.  Office hours are 8:00 a.m. to 4:30 p.m. Monday - Friday. Please note that voicemails left after 4:00 p.m. may not be returned until the following business day.  We are closed weekends and major holidays. You have access to a nurse at all times for urgent questions. Please call the main number to the clinic [phone] and follow the prompts.  For any non-urgent questions, you may also contact your provider using MyChart. We now offer e-Visits for anyone 6 and older to request care online for non-urgent symptoms. For details visit [email or web address].   Also download the MyChart app! Go to the app store, search "MyChart", open the app, select [place], and log in with your MyChart username and password.

## 2023-03-03 LAB — CANCER ANTIGEN 15-3: CA 15-3: 4.4 U/mL (ref 0.0–25.0)

## 2023-03-04 ENCOUNTER — Inpatient Hospital Stay: Payer: Medicare PPO | Admitting: Hematology

## 2023-03-04 VITALS — BP 126/78 | HR 78 | Temp 96.9°F | Resp 18 | Wt 135.4 lb

## 2023-03-04 DIAGNOSIS — E559 Vitamin D deficiency, unspecified: Secondary | ICD-10-CM

## 2023-03-04 DIAGNOSIS — Z171 Estrogen receptor negative status [ER-]: Secondary | ICD-10-CM

## 2023-03-04 DIAGNOSIS — C50411 Malignant neoplasm of upper-outer quadrant of right female breast: Secondary | ICD-10-CM | POA: Diagnosis not present

## 2023-03-04 NOTE — Patient Instructions (Signed)
 [facility] at [facility] Discharge Instructions   You were seen and examined today by Dr. [name] Saba.  He reviewed the results of your lab work which are normal/stable.   He reviewed the results of you mammogram which was normal.   We will see you back in 6 months. We will repeat lab work prior to this visit.   Return as scheduled.    Thank you for choosing  Cancer Center at [facility] to provide your oncology and hematology care.  To afford each patient quality time with our provider, please arrive at least 15 minutes before your scheduled appointment time.   If you have a lab appointment with the Cancer Center please come in thru the Main Entrance and check in at the main information desk.  You need to re-schedule your appointment should you arrive 10 or more minutes late.  We strive to give you quality time with our providers, and arriving late affects you and other patients whose appointments are after yours.  Also, if you no show three or more times for appointments you may be dismissed from the clinic at the providers discretion.     Again, thank you for choosing [facility].  Our hope is that these requests will decrease the amount of time that you wait before being seen by our physicians.       _____________________________________________________________  Should you have questions after your visit to [facility], please contact our office at [phone] and follow the prompts.  Our office hours are 8:00 a.m. and 4:30 p.m. Monday - Friday.  Please note that voicemails left after 4:00 p.m. may not be returned until the following business day.  We are closed weekends and major holidays.  You do have access to a nurse 24-7, just call the main number to the clinic [phone] and do not press any options, hold on the line and a nurse will answer the phone.    For prescription refill requests, have your pharmacy  contact our office and allow 72 hours.    Due to Covid, you will need to wear a mask upon entering the hospital. If you do not have a mask, a mask will be given to you at the Main Entrance upon arrival. For doctor visits, patients may have 1 support person age 13 or older with them. For treatment visits, patients can not have anyone with them due to social distancing guidelines and our immunocompromised population.

## 2023-03-04 NOTE — Progress Notes (Signed)
 [facility] [address], [place] [address]    Clinic Day:  03/04/23   Referring physician: Donetta Potts, MD  Patient Care Team: Donetta Potts, MD as PCP - General (Internal Medicine) Doreatha Massed, MD as Medical Oncologist (Medical Oncology) Therese Sarah, RN as Oncology Nurse Navigator (Medical Oncology)   ASSESSMENT & PLAN:   Assessment: 1.  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  Social/family history: - Lives at home with her husband.  Retired Runner, broadcasting/film/video.  Non-smoker. - Mother died of AML at age 60.  Maternal aunt had AML.  Maternal grandmother had leukemia, unknown type.  Maternal uncle had chronic leukemia.  Paternal aunt had bladder cancer.  Paternal first cousin had breast cancer.  Her son was diagnosed with GBM at age 23.    Plan: 1.  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 - Denies any new onset pains.  Denies any B symptoms. - Reviewed mammogram from 01/15/2023: BI-RADS Category 2.  Physical exam did not reveal any palpable adenopathy. - Labs: Normal LFTs.  CBC grossly normal.  Mild thrombocytopenia since 2018 is stable.  CA 15-3 is normal. - Recommend follow-up in 6 months with  repeat labs and vitamin D levels.    No orders of the defined types were placed in this encounter.     Doreatha Massed, MD   2/26/202511:57 AM  CHIEF COMPLAINT:   Diagnosis: Triple negative right breast cancer    Cancer Staging  Carcinoma of upper-outer quadrant of right breast in female, estrogen receptor negative (HCC) Staging form: Breast, AJCC 8th Edition - Clinical stage from 03/04/2022: Stage IB (cT1c, cN0, cM0, G3, ER-, PR-, HER2-) - Signed by Doreatha Massed, MD on 03/04/2022    Prior Therapy: right lumpectomy and SLNB on 02/05/2022   Current Therapy:  Docetaxel and cyclophosphamide    HISTORY OF PRESENT ILLNESS:   Oncology History  Carcinoma of upper-outer quadrant of right breast in female, estrogen receptor negative (HCC)  01/23/2022 Initial Diagnosis   Carcinoma of upper-outer quadrant of right breast in female, estrogen receptor negative (HCC)   03/04/2022 Cancer Staging   Staging form: Breast, AJCC 8th Edition - Clinical stage from 03/04/2022: Stage IB (cT1c, cN0, cM0, G3, ER-, PR-, HER2-) - Signed by Doreatha Massed, MD on 03/04/2022 Histopathologic type: Infiltrating duct carcinoma, NOS Stage prefix: Initial diagnosis Nuclear grade: G3 Histologic grading system: 3 grade system   03/17/2022 -  Chemotherapy   Patient is on Treatment Plan : BREAST TC q21d     04/21/2022 Genetic Testing   Negative genetic testing on the Custom Cancer Panel.  The report date is April 21, 2022.  The Custom-Cancer offered by [facility] includes sequencing and/or deletion/duplication analysis of the following 125 genes:  ADA, AIP, ALK, ANKRD26*, APC*, ATM*, AXIN2, BAP1, BARD1, BLM, BMPR1A, BRCA1, BRCA2, BRIP1, CARD11, CARMIL2, CASP8, CBL, CD27, CDC73, CDH1, CDK4, CDKN1B, CDKN2A (p14ARF), CDKN2A (p16INK4a), CEBPA, CHEK2, CTLA4, CTNNA1, CTPS1, DDX41, DICER1*, DOCK8, EGFR, ELANE, EPCAM*, ERCC6L2, ETV6, FADD, FAS, FASLG, FCHO1, FH*, FLCN, G6PC3, GATA2, GFI1*, GREM1*, HAX1, HOXB13,  IKZF1, IL2RA, IL2RB, ITK, KIT, KRAS, LZTR1, MAGT1, MAX*, MBD4, MCM4, MECOM, MEN1*, MET*, MITF, MLH1*, MSH2*, MSH3*, MSH6*, MUTYH, NBN, NF1*, NF2, NTHL1, PALB2, PDGFRA, PIK3CD, PIK3R1, PMS2*, POLD1*, POLE, POT1, PRKAR1A, PRKCD, PTCH1, PTEN*, PTPN11, RAC2, RAD51C, RAD51D, RASGRP1, RB1*, RET, RHOH, RMRP, RTEL1, RUNX1, SAMD9, SAMD9L, SDHA*, SDHAF2, SDHB, SDHC*, SDHD, SH2D1A, SMAD4, SMARCA4, SMARCB1, SMARCE1, SRP72, STAT3, STK11, STK4, STXBP2, SUFU, TERC, TERT, TMEM127, TNFRSF13B, TP53, TPP2, TSC1*, TSC2, VHL, WAS, and XIAP.       INTERVAL HISTORY:   Angel Barry is a 78 y.o. female seen for follow-up of triple negative breast cancer.  Reports appetite of 100% and energy level 75%.  PAST MEDICAL HISTORY:   Past Medical History: Past Medical History:  Diagnosis Date   Arthritis    Family history of breast cancer    Family history of kidney cancer    Family history of leukemia    GERD (gastroesophageal reflux disease)    Hypercholesteremia    Hypertension    Pre-diabetes     Surgical History: Past Surgical History:  Procedure Laterality Date   BREAST BIOPSY Right 01/14/2022   [place] RT BREAST BX W LOC DEV 1ST LESION IMG BX SPEC US GUIDE 01/14/2022 Edwin Cap, MD [facility]-UL[facility]AS[facility]   CATARACT EXTRACTION W/PHACO Left 12/18/2016   Procedure: CATARACT EXTRACTION PHACO AND INTRAOCULAR LENS PLACEMENT (IOC);  Surgeon: Fabio Pierce, MD;  Location: [facility];  Service: Ophthalmology;  Laterality: Left;  CDE: 9.55   CATARACT EXTRACTION W/PHACO Right 08/06/2018   Procedure: CATARACT EXTRACTION PHACO AND INTRAOCULAR LENS PLACEMENT (IOC);  Surgeon: Fabio Pierce, MD;  Location: [facility];  Service: Ophthalmology;  Laterality: Right;  CDE: 13.46   PARTIAL MASTECTOMY WITH AXILLARY SENTINEL LYMPH NODE BIOPSY Right 02/05/2022   PORTACATH PLACEMENT Left 03/14/2022   Procedure: INSERTION PORT-A-CATH;  Surgeon: Lucretia Roers, MD;  Location: [facility];  Service: General;  Laterality: Left;   TUBAL LIGATION      Social  History: Social History   Socioeconomic History   Marital status: Married    Spouse name: Not on file   Number of children: Not on file   Years of education: Not on file   Highest education level: Not on file  Occupational History   Not on file  Tobacco Use   Smoking status: Never   Smokeless tobacco: Never  Vaping Use   Vaping status: Never Used  Substance and Sexual Activity   Alcohol use: Yes    Comment: very rarely   Drug use: No   Sexual activity: Yes    Birth control/protection: Post-menopausal  Other Topics Concern   Not on file  Social History Narrative   Not on file   Social Drivers of Health   Financial Resource Strain: Low Risk  (10/08/2020)   Received from [facility], [facility]   Overall Financial Resource Strain (CARDIA)    Difficulty of Paying Living Expenses: Not hard at all  Food Insecurity: No Food Insecurity (03/05/2022)   Hunger Vital Sign    Worried About Running Out of Food in the Last Year: Never true    Ran Out of Food in the Last Year: Never true  Transportation Needs: No Transportation Needs (03/05/2022)  PRAPARE - Administrator, Civil Service (Medical): No    Lack of Transportation (Non-Medical): No  Physical Activity: Sufficiently Active (10/08/2020)   Received from [facility], [facility]   Exercise Vital Sign    Days of Exercise per Week: 4 days    Minutes of Exercise per Session: 50 min  Stress: No Stress Concern Present (10/08/2020)   Received from [facility], [facility]ational [facility] - Occupational Stress Questionnaire    Feeling of Stress : Only a little  Social Connections: Unknown (05/20/2021)   Received from [facility], [facility]   Social Network    Social Network: Not on file  Intimate Partner Violence: Not At Risk (03/05/2022)   Humiliation, Afraid, Rape, and Kick questionnaire    Fear of Current or Ex-Partner: No    Emotionally Abused: No    Physically Abused:  No    Sexually Abused: No    Family History: Family History  Problem Relation Age of Onset   Atrial fibrillation Mother    Leukemia Mother 66       AML   Irregular heart beat Father    Leukemia Maternal Aunt 80       AML   Leukemia Maternal Uncle 60       CLL   Kidney cancer Paternal Aunt 51   Leukemia Maternal Grandmother 38   Stroke Paternal Grandmother    Alcoholism Paternal Grandfather    Brain cancer Son 27       Glioblastoma   Melanoma Cousin        Maternal first cousin   Breast cancer Cousin        paternal first cousin   Breast cancer Cousin        paternal first cousin's daughter    Current Medications:  Current Outpatient Medications:    acetaminophen (TYLENOL) 500 MG tablet, Take 500 mg by mouth every 6 (six) hours as needed for moderate pain., Disp: , Rfl:    ascorbic acid (VITAMIN C) 500 MG tablet, Take 500 mg by mouth in the morning., Disp: , Rfl:    cetirizine (ZYRTEC) 10 MG tablet, Take 10 mg by mouth daily as needed for allergies., Disp: , Rfl:    Cholecalciferol (VITAMIN D) 50 MCG (2000 UT) tablet, Take 2,000 Units by mouth in the morning., Disp: , Rfl:    ELDERBERRY PO, Take 10 mLs by mouth in the morning., Disp: , Rfl:    fluticasone (FLONASE) 50 MCG/ACT nasal spray, Place 2 sprays into both nostrils in the morning., Disp: , Rfl:    fosinopril-hydrochlorothiazide (MONOPRIL-HCT) 20-12.5 MG tablet, Take 1 tablet by mouth at bedtime., Disp: , Rfl:    lidocaine-prilocaine (EMLA) cream, Apply 1 Application topically as needed. Apply a quarter-sized amount to port a cath site and cover with plastic wrap one hour prior to infusion appointments, Disp: 30 g, Rfl: 3   pantoprazole (PROTONIX) 40 MG tablet, Take 40 mg by mouth daily before breakfast., Disp: , Rfl:    polyethylene glycol (MIRALAX / GLYCOLAX) 17 g packet, Take 17 g by mouth at bedtime., Disp: , Rfl:    Polyethylene Glycol 400 (BLINK TEARS OP), Place 2 drops into both eyes daily as needed  (dry/irritated eyes.)., Disp: , Rfl:    prochlorperazine (COMPAZINE) 10 MG tablet, Take 1 tablet (10 mg total) by mouth every 6 (six) hours as needed for nausea or vomiting., Disp: 60 tablet, Rfl: 3   simvastatin (ZOCOR) 10 MG  tablet, Take 10 mg by mouth every evening., Disp: , Rfl:    Zinc 50 MG TABS, Take 50 mg by mouth in the morning., Disp: , Rfl:    Allergies: Allergies  Allergen Reactions   Macrobid [Nitrofurantoin Macrocrystal] Nausea And Vomiting    REVIEW OF SYSTEMS:   Review of Systems  Constitutional:  Negative for chills and fever.  HENT:   Negative for lump/mass, mouth sores, nosebleeds, sore throat and trouble swallowing.   Eyes:  Negative for eye problems.  Respiratory:  Negative for shortness of breath.   Cardiovascular:  Positive for palpitations. Negative for chest pain and leg swelling.  Gastrointestinal:  Negative for abdominal pain, constipation, nausea and vomiting.  Genitourinary:  Negative for bladder incontinence, difficulty urinating, dysuria, frequency, hematuria and nocturia.   Musculoskeletal:  Negative for arthralgias, back pain, flank pain, myalgias and neck pain.  Skin:  Negative for itching and rash.  Neurological:  Positive for headaches. Negative for dizziness and numbness.  Hematological:  Does not bruise/bleed easily.  Psychiatric/Behavioral:  Negative for depression, sleep disturbance and suicidal ideas. The patient is not nervous/anxious.   All other systems reviewed and are negative.    VITALS:   Blood pressure 126/78, pulse 78, temperature (!) 96.9 F (36.1 C), temperature source Tympanic, resp. rate 18, weight 135 lb 5.8 oz (61.4 kg), SpO2 98%.  Wt Readings from Last 3 Encounters:  03/04/23 135 lb 5.8 oz (61.4 kg)  09/02/22 132 lb 3.2 oz (60 kg)  08/26/22 132 lb 6.4 oz (60.1 kg)    Body mass index is 24.76 kg/m.  Performance status (ECOG): 1 - Symptomatic but completely ambulatory  PHYSICAL EXAM:   Physical Exam Vitals and  nursing note reviewed. Exam conducted with a chaperone present.  Constitutional:      Appearance: Normal appearance.  Cardiovascular:     Rate and Rhythm: Normal rate and regular rhythm.     Pulses: Normal pulses.     Heart sounds: Normal heart sounds.  Pulmonary:     Effort: Pulmonary effort is normal.     Breath sounds: Normal breath sounds.  Abdominal:     Palpations: Abdomen is soft. There is no hepatomegaly, splenomegaly or mass.     Tenderness: There is no abdominal tenderness.  Musculoskeletal:     Right lower leg: No edema.     Left lower leg: No edema.  Lymphadenopathy:     Cervical: No cervical adenopathy.     Right cervical: No superficial, deep or posterior cervical adenopathy.    Left cervical: No superficial, deep or posterior cervical adenopathy.     Upper Body:     Right upper body: No supraclavicular or axillary adenopathy.     Left upper body: No supraclavicular or axillary adenopathy.  Neurological:     General: No focal deficit present.     Mental Status: She is alert and oriented to person, place, and time.  Psychiatric:        Mood and Affect: Mood normal.        Behavior: Behavior normal.     LABS:      Latest Ref Rng & Units 03/02/2023   12:42 PM 08/26/2022   12:12 PM 05/19/2022    8:07 AM  CBC  WBC 4.0 - 10.5 K/uL 5.7  5.6  7.9   Hemoglobin 12.0 - 15.0 g/dL 01.0  27.2  53.6   Hematocrit 36.0 - 46.0 % 39.6  39.2  34.6   Platelets 150 - 400 K/uL 117  155  147       Latest Ref Rng & Units 03/02/2023   12:42 PM 08/26/2022   12:12 PM 05/19/2022    8:07 AM  CMP  Glucose 70 - 99 mg/dL 119  147  829   BUN 8 - 23 mg/dL 16  14  10    Creatinine 0.44 - 1.00 mg/dL 5.62  1.30  8.65   Sodium 135 - 145 mmol/L 137  139  138   Potassium 3.5 - 5.1 mmol/L 3.7  3.3  3.6   Chloride 98 - 111 mmol/L 104  105  105   CO2 22 - 32 mmol/L 24  24  25    Calcium 8.9 - 10.3 mg/dL 9.2  9.0  8.8   Total Protein 6.5 - 8.1 g/dL 6.8  6.6  6.5   Total Bilirubin 0.0 - 1.2 mg/dL  0.7  0.7  0.7   Alkaline Phos 38 - 126 U/L 47  43  49   AST 15 - 41 U/L 18  16  16    ALT 0 - 44 U/L 16  16  15       No results found for: "CEA1", "CEA" / No results found for: "CEA1", "CEA" No results found for: "PSA1" No results found for: "HQI696" No results found for: "CAN125"  No results found for: "TOTALPROTELP", "ALBUMINELP", "A1GS", "A2GS", "BETS", "BETA2SER", "GAMS", "MSPIKE", "SPEI" No results found for: "TIBC", "FERRITIN", "IRONPCTSAT" No results found for: "LDH"   STUDIES:   No results found.

## 2023-06-03 ENCOUNTER — Inpatient Hospital Stay: Payer: Medicare PPO | Attending: Hematology

## 2023-06-03 DIAGNOSIS — Z17421 Hormone receptor negative with human epidermal growth factor receptor 2 negative status: Secondary | ICD-10-CM | POA: Diagnosis not present

## 2023-06-03 DIAGNOSIS — Z452 Encounter for adjustment and management of vascular access device: Secondary | ICD-10-CM | POA: Insufficient documentation

## 2023-06-03 DIAGNOSIS — C50411 Malignant neoplasm of upper-outer quadrant of right female breast: Secondary | ICD-10-CM | POA: Diagnosis not present

## 2023-06-03 MED ORDER — HEPARIN SOD (PORK) LOCK FLUSH 100 UNIT/ML IV SOLN
500.0000 [IU] | Freq: Once | INTRAVENOUS | Status: AC
  Administered 2023-06-03: 500 [IU] via INTRAVENOUS

## 2023-06-03 MED ORDER — SODIUM CHLORIDE 0.9% FLUSH
10.0000 mL | INTRAVENOUS | Status: DC | PRN
  Administered 2023-06-03: 10 mL via INTRAVENOUS

## 2023-06-03 NOTE — Patient Instructions (Signed)
 [facility] [place] - A DEPT OF [facility]  Discharge Instructions: Thank you for choosing [facility] to provide your oncology and hematology care.  If you have a lab appointment with the Cancer Center - please note that after April 8th, 2024, all labs will be drawn in the cancer center.  You do not have to check in or register with the main entrance as you have in the past but will complete your check-in in the cancer center.  Wear comfortable clothing and clothing appropriate for easy access to any Portacath or PICC line.   We strive to give you quality time with your provider. You may need to reschedule your appointment if you arrive late (15 or more minutes).  Arriving late affects you and other patients whose appointments are after yours.  Also, if you miss three or more appointments without notifying the office, you may be dismissed from the clinic at the provider's discretion.      For prescription refill requests, have your pharmacy contact our office and allow 72 hours for refills to be completed.    Today you received port flush.  BELOW ARE SYMPTOMS THAT SHOULD BE REPORTED IMMEDIATELY: *FEVER GREATER THAN 100.4 F (38 C) OR HIGHER *CHILLS OR SWEATING *NAUSEA AND VOMITING THAT IS NOT CONTROLLED WITH YOUR NAUSEA MEDICATION *UNUSUAL SHORTNESS OF BREATH *UNUSUAL BRUISING OR BLEEDING *URINARY PROBLEMS (pain or burning when urinating, or frequent urination) *BOWEL PROBLEMS (unusual diarrhea, constipation, pain near the anus) TENDERNESS IN MOUTH AND THROAT WITH OR WITHOUT PRESENCE OF ULCERS (sore throat, sores in mouth, or a toothache) UNUSUAL RASH, SWELLING OR PAIN  UNUSUAL VAGINAL DISCHARGE OR ITCHING   Items with * indicate a potential emergency and should be followed up as soon as possible or go to the Emergency Department if any problems should occur.  Please show the CHEMOTHERAPY ALERT CARD or IMMUNOTHERAPY ALERT CARD at check-in to the  Emergency Department and triage nurse.  Should you have questions after your visit or need to cancel or reschedule your appointment, please contact [facility] [place] - A DEPT OF [facility] [phone]  and follow the prompts.  Office hours are 8:00 a.m. to 4:30 p.m. Monday - Friday. Please note that voicemails left after 4:00 p.m. may not be returned until the following business day.  We are closed weekends and major holidays. You have access to a nurse at all times for urgent questions. Please call the main number to the clinic [phone] and follow the prompts.  For any non-urgent questions, you may also contact your provider using MyChart. We now offer e-Visits for anyone 17 and older to request care online for non-urgent symptoms. For details visit [email or web address].   Also download the MyChart app! Go to the app store, search "MyChart", open the app, select [place], and log in with your MyChart username and password.

## 2023-06-03 NOTE — Progress Notes (Signed)
 Angel Barry presented for Portacath access and flush.  Portacath located left chest wall accessed with  H 20 needle.  Good blood return present. Portacath flushed with 20ml NS and 500U/55ml Heparin  and needle removed intact. No bruising or swelling noted at the the site.  Procedure tolerated well and without incident.     Discharged from clinic ambulatory in stable condition. Alert and oriented x 3. F/U with [facility] as scheduled.

## 2023-07-29 DIAGNOSIS — D3132 Benign neoplasm of left choroid: Secondary | ICD-10-CM | POA: Diagnosis not present

## 2023-07-29 DIAGNOSIS — H40023 Open angle with borderline findings, high risk, bilateral: Secondary | ICD-10-CM | POA: Diagnosis not present

## 2023-07-29 DIAGNOSIS — H52223 Regular astigmatism, bilateral: Secondary | ICD-10-CM | POA: Diagnosis not present

## 2023-07-29 DIAGNOSIS — H43813 Vitreous degeneration, bilateral: Secondary | ICD-10-CM | POA: Diagnosis not present

## 2023-07-29 DIAGNOSIS — H524 Presbyopia: Secondary | ICD-10-CM | POA: Diagnosis not present

## 2023-07-29 DIAGNOSIS — Z961 Presence of intraocular lens: Secondary | ICD-10-CM | POA: Diagnosis not present

## 2023-08-26 ENCOUNTER — Inpatient Hospital Stay: Payer: Medicare PPO | Attending: Hematology

## 2023-08-26 VITALS — BP 121/67 | HR 74 | Temp 98.0°F | Resp 18

## 2023-08-26 DIAGNOSIS — Z171 Estrogen receptor negative status [ER-]: Secondary | ICD-10-CM

## 2023-08-26 DIAGNOSIS — Z17421 Hormone receptor negative with human epidermal growth factor receptor 2 negative status: Secondary | ICD-10-CM | POA: Insufficient documentation

## 2023-08-26 DIAGNOSIS — E559 Vitamin D deficiency, unspecified: Secondary | ICD-10-CM | POA: Insufficient documentation

## 2023-08-26 DIAGNOSIS — C50411 Malignant neoplasm of upper-outer quadrant of right female breast: Secondary | ICD-10-CM | POA: Diagnosis not present

## 2023-08-26 DIAGNOSIS — E876 Hypokalemia: Secondary | ICD-10-CM | POA: Insufficient documentation

## 2023-08-26 LAB — CBC WITH DIFFERENTIAL/PLATELET
Abs Immature Granulocytes: 0.02 K/uL (ref 0.00–0.07)
Basophils Absolute: 0 K/uL (ref 0.0–0.1)
Basophils Relative: 1 %
Eosinophils Absolute: 0.1 K/uL (ref 0.0–0.5)
Eosinophils Relative: 2 %
HCT: 38.4 % (ref 36.0–46.0)
Hemoglobin: 13.1 g/dL (ref 12.0–15.0)
Immature Granulocytes: 0 %
Lymphocytes Relative: 25 %
Lymphs Abs: 1.3 K/uL (ref 0.7–4.0)
MCH: 31.7 pg (ref 26.0–34.0)
MCHC: 34.1 g/dL (ref 30.0–36.0)
MCV: 93 fL (ref 80.0–100.0)
Monocytes Absolute: 0.5 K/uL (ref 0.1–1.0)
Monocytes Relative: 9 %
Neutro Abs: 3.2 K/uL (ref 1.7–7.7)
Neutrophils Relative %: 63 %
Platelets: 118 K/uL — ABNORMAL LOW (ref 150–400)
RBC: 4.13 MIL/uL (ref 3.87–5.11)
RDW: 12.3 % (ref 11.5–15.5)
WBC: 5.1 K/uL (ref 4.0–10.5)
nRBC: 0 % (ref 0.0–0.2)

## 2023-08-26 LAB — COMPREHENSIVE METABOLIC PANEL WITH GFR
ALT: 23 U/L (ref 0–44)
AST: 22 U/L (ref 15–41)
Albumin: 3.9 g/dL (ref 3.5–5.0)
Alkaline Phosphatase: 50 U/L (ref 38–126)
Anion gap: 13 (ref 5–15)
BUN: 16 mg/dL (ref 8–23)
CO2: 24 mmol/L (ref 22–32)
Calcium: 8.9 mg/dL (ref 8.9–10.3)
Chloride: 104 mmol/L (ref 98–111)
Creatinine, Ser: 0.77 mg/dL (ref 0.44–1.00)
GFR, Estimated: >60 mL/min (ref >60)
Glucose, Bld: 141 mg/dL — ABNORMAL HIGH (ref 70–99)
Potassium: 3.3 mmol/L — ABNORMAL LOW (ref 3.5–5.1)
Sodium: 141 mmol/L (ref 135–145)
Total Bilirubin: 0.6 mg/dL (ref 0.0–1.2)
Total Protein: 6.5 g/dL (ref 6.5–8.1)

## 2023-08-26 LAB — VITAMIN D 25 HYDROXY (VIT D DEFICIENCY, FRACTURES): Vit D, 25-Hydroxy: 51.77 ng/mL (ref 30–100)

## 2023-08-26 NOTE — Progress Notes (Signed)
 Patients port flushed without difficulty.  Good blood return noted with no bruising or swelling noted at site.  Band aid applied.  VSS with discharge and left in satisfactory condition with no s/s of distress noted.

## 2023-08-27 LAB — CANCER ANTIGEN 15-3: CA 15-3: 4 U/mL (ref 0.0–25.0)

## 2023-09-02 ENCOUNTER — Inpatient Hospital Stay: Payer: Medicare PPO | Admitting: Oncology

## 2023-09-02 ENCOUNTER — Other Ambulatory Visit (HOSPITAL_COMMUNITY): Payer: Self-pay | Admitting: Oncology

## 2023-09-02 VITALS — BP 122/77 | HR 66 | Temp 98.3°F | Resp 16 | Wt 140.6 lb

## 2023-09-02 DIAGNOSIS — D696 Thrombocytopenia, unspecified: Secondary | ICD-10-CM | POA: Insufficient documentation

## 2023-09-02 DIAGNOSIS — Z171 Estrogen receptor negative status [ER-]: Secondary | ICD-10-CM | POA: Diagnosis not present

## 2023-09-02 DIAGNOSIS — E876 Hypokalemia: Secondary | ICD-10-CM | POA: Diagnosis not present

## 2023-09-02 DIAGNOSIS — C50411 Malignant neoplasm of upper-outer quadrant of right female breast: Secondary | ICD-10-CM

## 2023-09-02 DIAGNOSIS — Z17421 Hormone receptor negative with human epidermal growth factor receptor 2 negative status: Secondary | ICD-10-CM | POA: Diagnosis not present

## 2023-09-02 DIAGNOSIS — E559 Vitamin D deficiency, unspecified: Secondary | ICD-10-CM

## 2023-09-02 DIAGNOSIS — Z9889 Other specified postprocedural states: Secondary | ICD-10-CM

## 2023-09-02 NOTE — Assessment & Plan Note (Signed)
Potassium

## 2023-09-02 NOTE — Assessment & Plan Note (Addendum)
-  This is been chronically low for several years now. -She denies any easy bruising or bleeding.  There is no splenomegaly.

## 2023-09-02 NOTE — Assessment & Plan Note (Addendum)
-  She denies any new lumps or bumps. - Denies any new onset pains.  Denies any B symptoms. - Reviewed mammogram from 01/15/2023: BI-RADS Category 2.  Physical exam did not reveal any palpable adenopathy. - Labs: Normal LFTs.  CBC grossly normal.  Mild thrombocytopenia since 2018 is stable.  CA 15-3 is normal. -We did discuss she possibly could have some lymphedema in her right breast and into her right arm.  Recommended massaging techniques that she can look up online or referral to the lymphedema clinic.  She would like to review this on her own and get back to me. - Recommend follow-up in 6 months with repeat labs and vitamin D  levels. -She will be due for a repeat mammogram in January/February 2026.  Will get that scheduled.

## 2023-09-02 NOTE — Progress Notes (Signed)
 [facility] OFFICE PROGRESS NOTE  Angel Vaughn FALCON, MD  ASSESSMENT & PLAN:    Assessment & Plan Carcinoma of upper-outer quadrant of right breast in female, estrogen receptor negative (HCC) -She denies any new lumps or bumps. - Denies any new onset pains.  Denies any B symptoms. - Reviewed mammogram from 01/15/2023: BI-RADS Category 2.  Physical exam did not reveal any palpable adenopathy. - Labs: Normal LFTs.  CBC grossly normal.  Mild thrombocytopenia since 2018 is stable.  CA 15-3 is normal. -We did discuss she possibly could have some lymphedema in her right breast and into her right arm.  Recommended massaging techniques that she can look up online or referral to the lymphedema clinic.  She would like to review this on her own and get back to me. - Recommend follow-up in 6 months with repeat labs and vitamin D  levels. -She will be due for a repeat mammogram in January/February 2026.  Will get that scheduled.  Vitamin D  deficiency -Vitamin D  level 51.77.  She takes calcium and vitamin D  daily.   -Continue for now. Hypokalemia -She is currently on a diuretic and reports that she does pee a lot.   -We discussed continuing to monitor her potassium levels.  If she consumes a normal American diet, her potassium level should be WNL but she has had some diarrhea as well as increased urinary urgency secondary to her diuretic.  She will be mindful and eat a banana every few days. 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 -She denies any easy bruising or bleeding.  There is no splenomegaly.  Orders Placed This Encounter  Procedures   Vitamin D  25 hydroxy    Standing Status:   Future    Expected Date:   01/02/2024    Expiration Date:   04/01/2024   Cancer antigen 15-3    Standing Status:   Future    Expected Date:   01/02/2024    Expiration Date:   04/01/2024   CBC with Differential    Standing Status:   Future    Expected Date:    01/02/2024    Expiration Date:   04/01/2024   Comprehensive metabolic panel    Standing Status:   Future    Expected Date:   01/02/2024    Expiration Date:   04/01/2024    INTERVAL HISTORY: Patient returns for follow-up.  Reports she has overall been doing well.  Appetite is 100% energy levels are 75%.  She denies any pain.  She denies any new lumps or bumps.  She has a cough secondary to allergies.  She has constipation but takes MiraLAX and a stool softener as needed.  Reports she is still coping with the death of her son from glioblastoma in December.  Reports she will occasionally have a little bit of swelling in her right arm and in her breast.  She has not been seen by lymphedema clinic.  We reviewed cbc, CMP, CA 15-3 and vitamin D .  SUMMARY OF HEMATOLOGIC HISTORY: Oncology History  Carcinoma of upper-outer quadrant of right breast in female, estrogen receptor negative (HCC)  01/23/2022 Initial Diagnosis   Carcinoma of upper-outer quadrant of right breast in female, estrogen receptor negative (HCC)   03/04/2022 Cancer Staging   Staging form: Breast, AJCC 8th Edition - Clinical stage from 03/04/2022: Stage IB (cT1c, cN0, cM0, G3, ER-, PR-, HER2-) - Signed by Rogers Hai, MD on 03/04/2022 Histopathologic type: Infiltrating duct carcinoma, NOS Stage  prefix: Initial diagnosis Nuclear grade: G3 Histologic grading system: 3 grade system   03/17/2022 - 05/21/2022 Chemotherapy   Patient is on Treatment Plan : BREAST TC q21d     04/21/2022 Genetic Testing   Negative genetic testing on the Custom Cancer Panel.  The report date is April 21, 2022.  The Custom-Cancer offered by [facility] includes sequencing and/or deletion/duplication analysis of the following 125 genes:  ADA, AIP, ALK, ANKRD26*, APC*, ATM*, AXIN2, BAP1, BARD1, BLM, BMPR1A, BRCA1, BRCA2, BRIP1, CARD11, CARMIL2, CASP8, CBL, CD27, CDC73, CDH1, CDK4, CDKN1B, CDKN2A (p14ARF), CDKN2A (p16INK4a), CEBPA, CHEK2, CTLA4, CTNNA1, CTPS1,  DDX41, DICER1*, DOCK8, EGFR, ELANE, EPCAM*, ERCC6L2, ETV6, FADD, FAS, FASLG, FCHO1, FH*, FLCN, G6PC3, GATA2, GFI1*, GREM1*, HAX1, HOXB13, IKZF1, IL2RA, IL2RB, ITK, KIT, KRAS, LZTR1, MAGT1, MAX*, MBD4, MCM4, MECOM, MEN1*, MET*, MITF, MLH1*, MSH2*, MSH3*, MSH6*, MUTYH, NBN, NF1*, NF2, NTHL1, PALB2, PDGFRA, PIK3CD, PIK3R1, PMS2*, POLD1*, POLE, POT1, PRKAR1A, PRKCD, PTCH1, PTEN*, PTPN11, RAC2, RAD51C, RAD51D, RASGRP1, RB1*, RET, RHOH, RMRP, RTEL1, RUNX1, SAMD9, SAMD9L, SDHA*, SDHAF2, SDHB, SDHC*, SDHD, SH2D1A, SMAD4, SMARCA4, SMARCB1, SMARCE1, SRP72, STAT3, STK11, STK4, STXBP2, SUFU, TERC, TERT, TMEM127, TNFRSF13B, TP53, TPP2, TSC1*, TSC2, VHL, WAS, and XIAP.       CBC    Component Value Date/Time   WBC 5.1 08/26/2023 1155   RBC 4.13 08/26/2023 1155   HGB 13.1 08/26/2023 1155   HCT 38.4 08/26/2023 1155   PLT 118 (L) 08/26/2023 1155   MCV 93.0 08/26/2023 1155   MCH 31.7 08/26/2023 1155   MCHC 34.1 08/26/2023 1155   RDW 12.3 08/26/2023 1155   LYMPHSABS 1.3 08/26/2023 1155   MONOABS 0.5 08/26/2023 1155   EOSABS 0.1 08/26/2023 1155   BASOSABS 0.0 08/26/2023 1155       Latest Ref Rng & Units 08/26/2023   11:55 AM 03/02/2023   12:42 PM 08/26/2022   12:12 PM  CMP  Glucose 70 - 99 mg/dL 858  898  864   BUN 8 - 23 mg/dL 16  16  14    Creatinine 0.44 - 1.00 mg/dL 9.22  9.13  9.24   Sodium 135 - 145 mmol/L 141  137  139   Potassium 3.5 - 5.1 mmol/L 3.3  3.7  3.3   Chloride 98 - 111 mmol/L 104  104  105   CO2 22 - 32 mmol/L 24  24  24    Calcium 8.9 - 10.3 mg/dL 8.9  9.2  9.0   Total Protein 6.5 - 8.1 g/dL 6.5  6.8  6.6   Total Bilirubin 0.0 - 1.2 mg/dL 0.6  0.7  0.7   Alkaline Phos 38 - 126 U/L 50  47  43   AST 15 - 41 U/L 22  18  16    ALT 0 - 44 U/L 23  16  16       No results found for: FERRITIN, VITAMINB12  There were no vitals filed for this visit.  Review of System:  Review of Systems  Constitutional:  Positive for malaise/fatigue.  Respiratory:  Positive for cough.    Gastrointestinal:  Positive for constipation.  Psychiatric/Behavioral:  Positive for depression.     Physical Exam: Physical Exam Constitutional:      Appearance: Normal appearance.  HENT:     Head: Normocephalic and atraumatic.  Eyes:     Pupils: Pupils are equal, round, and reactive to light.  Cardiovascular:     Rate and Rhythm: Normal rate and regular rhythm.     Heart sounds: Normal heart sounds. No murmur  heard. Pulmonary:     Effort: Pulmonary effort is normal.     Breath sounds: Normal breath sounds. No wheezing.  Chest:  Breasts:    Right: Normal. No swelling or tenderness.     Left: Normal.     Comments: No palpable masses or lymphadenopathy. Abdominal:     General: Bowel sounds are normal. There is no distension.     Palpations: Abdomen is soft.     Tenderness: There is no abdominal tenderness.  Musculoskeletal:        General: Normal range of motion.     Cervical back: Normal range of motion.  Skin:    General: Skin is warm and dry.     Findings: No rash.  Neurological:     Mental Status: She is alert and oriented to person, place, and time.     Gait: Gait is intact.  Psychiatric:        Mood and Affect: Mood and affect normal.        Cognition and Memory: Memory normal.        Judgment: Judgment normal.      I spent 25 minutes dedicated to the care of this patient (face-to-face and non-face-to-face) on the date of the encounter to include what is described in the assessment and plan.,  Delon Hope, NP 09/02/2023 11:23 AM

## 2023-09-02 NOTE — Assessment & Plan Note (Addendum)
-  She is currently on a diuretic and reports that she does pee a lot.   -We discussed continuing to monitor her potassium levels.  If she consumes a normal American diet, her potassium level should be WNL but she has had some diarrhea as well as increased urinary urgency secondary to her diuretic.  She will be mindful and eat a banana every few days.

## 2023-09-09 DIAGNOSIS — C50411 Malignant neoplasm of upper-outer quadrant of right female breast: Secondary | ICD-10-CM | POA: Diagnosis not present

## 2023-09-09 DIAGNOSIS — Z171 Estrogen receptor negative status [ER-]: Secondary | ICD-10-CM | POA: Diagnosis not present

## 2023-09-09 NOTE — Progress Notes (Signed)
 Radiation Oncology Follow Up Visit Note  Patient Name: Angel Barry Patient Age: 78 y.o. Encounter Date: 09/09/2023  Patient Care Team: Trudy Vaughn Lerner, MD as PCP - General (Family Medicine) Dannielle Alm Locus, MD (Radiation Oncology) Rogers Hai, MD (Hematology and Oncology) Kallie Manuelita Rosella, MD (General Surgery)   Diagnoses: 1. 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  Germline mutation negative. ECOG performance status of 1. She is 1 year and 1 month from completion of postoperative radiation therapy to the right breast with no evidence of recurrent disease   Interval Since Completion of Treatment: 78 years and 1 month  Disease Status: No evidence of disease: 09/09/23  Toxicity/Adverse Effects: None   Care Plan: Continue with surveillance  Plan: -Right Breast Cancer Continue with follow up with medical oncology, Dr. Davonna and Delon Hope, NP Annual breast mammograms Monthly breast self exams Follow up in radiation oncology can be on an as needed basis  -Cancer Survivorship Issues Encouraged to follow up with PCP and other health care providers for non-cancer related conditions and preventive vaccinations Advised to continue range of motion exercises for shoulder Counseled regarding lymphedema (if bothersome to be addressed by [facility]) Encouraged active lifestyle, healthy diet and weight management, limited EtOH intake, and avoiding smoking Addressed that  supplement use is not typically recommended except for deficiencies, inadequate diet or for prevention of a specific other condition that they may be susceptible to develop (such as osteoporosis with the use of certain endocrine therapies) Addressed age appropriate cancer screening and prevention including risk reduction for skin cancers     Interval History: Angel [facility] is a 78 year old female with a history of breast cancer who presents with fatigue and concerns related to her past treatment.  She experiences intermittent fatigue that has been more noticeable recently, particularly in the spring, although she maintains her daily routine and takes afternoon naps. Her fatigue had improved after completing radiation therapy a year and a month ago.  She reports tenderness on the right lateral aspect of her breast and occasional numbness and swelling under her arm. She has no difficulty raising her arm over her head.  She experiences occasional shortness of breath, especially when using stairs, but this has not changed since before her radiation treatment. She has a persistent cough in her throat following a cold in July, for which she took two COVID-19 tests that were negative.  Her appetite has returned, and she is gaining weight. She describes her emotional state as primarily grief, with some depression, following the death of her son from a brain tumor. She finds support through her church and her husband and does not feel the need to speak with a primary care doctor about her grief at this time.  She has no bone pain and is not anxious, although staying busy helps her emotional state. 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  Past Medical, Surgical, Family and Social Histories reviewed and updated in the electronic medical record. Hematology/Oncology History  Carcinoma of upper-outer quadrant of  right breast in female, estrogen receptor negative (CMS-HCC)  01/14/2022 Initial Diagnosis   Carcinoma of upper-outer quadrant of right breast in female, estrogen receptor negative (CMS-HCC)   01/14/2022 Biopsy   Right Breast Core Needle Biopsy 11:00  [id number] A.  RIGHT BREAST, MASS @ 11:00, BIOPSY:  Invasive poorly differentiated ductal adenocarcinoma, grade 3 (3+3+3)  ER negative, PR Negative HER2  Negative (0) Ki-67 98%   02/05/2022 -  Cancer Staged   Staging form: Breast, AJCC 8th Edition - Clinical stage from 02/05/2022: Stage IB (cT1c, cN0, cM0, G3, ER-, PR-, HER2-) - Signed by Dannielle Alm Locus, MD on 06/18/2022    02/05/2022 -  Cancer Staged   Staging form: Breast, AJCC 8th Edition - Pathologic stage from 02/05/2022: Stage IB (pT1c, pN0(sn), cM0, G3, ER-, PR-, HER2-) - Signed by Dannielle Alm Locus, MD on 06/18/2022    02/05/2022 Surgery   Right Breast Partial Mastectomy with Sentinel Lymph Node Excision ([place]) CASE: [id number]  FINAL MICROSCOPIC DIAGNOSIS:   A. BREAST, RIGHT, PARTIAL MASTECTOMY:  -  Invasive ductal carcinoma (NOS)/invasive mammary carcinoma (NST)  -  13 mm in greatest dimension.  -  Overall Nottingham histologic score III/III (tubular score 3/3;  nuclear pleomorphism 3/3; mitotic rate 3/3).  -  Margins negative: Closest margins 1 mm-anterior; 2 mm-inferior; 5  mm-lateral, all others greater than or equal to 10 mm  pT1c pN0   B. LYMPH NODE, SENTINEL, RIGHT, BIOPSY:  -  3 lymph nodes, negative for malignancy (0/3), multiple levels  examined.   ONCOLOGY TABLE:   INVASIVE CARCINOMA OF THE BREAST:  Resection   Procedure: Partial mastectomy  Specimen Laterality: Right  Histologic Type: Invasive ductal carcinoma (NOS)/invasive mammary  carcinoma (NST)  Histologic Grade:       Glandular (Acinar)/Tubular Differentiation: 3/3       Nuclear Pleomorphism: 3/3       Mitotic Rate: 3/3       Overall Grade: III/III  Tumor Size: 13 x 8 x 4  mm  Ductal Carcinoma In Situ: Not identified  Lymphatic and/or Vascular Invasion: Not identified  Treatment Effect in the Breast: No known presurgical therapy  Margins: All margins negative for invasive carcinoma       Distance from Closest Margin (mm): 1 mm-anterior; 2 mm-inferior; 5  mm-lateral; all others greater than 10 mm       Specify Closest Margin (required only if <45mm): See above  DCIS Margins: N/A       Distance from Closest Margin (mm): N/A       Specify Closest Margin (required only if <47mm): N/A  Regional Lymph Nodes:       Number of Lymph Nodes Examined: 3       Number of Sentinel Nodes Examined: 3       Number of Lymph Nodes with Macrometastases (>2 mm): 0       Number of Lymph Nodes with Micrometastases: 0       Number of Lymph Nodes with Isolated Tumor Cells (=0.2 mm or =200  cells): 0       Size of Largest Metastatic Deposit (mm): N/A       Extranodal Extension: N/A  Distant Metastasis:       Distant Site(s) Involved: N/A  Breast Biomarker Testing Performed on Previous Biopsy:       Testing Performed on Case  Number: [id number]             Estrogen Receptor: Negative             Progesterone Receptor: Negative             HER2: Negative             Ki-67: 98%  Pathologic Stage Classification (pTNM, AJCC 8th Edition): pT1c, pN0  Representative Tumor Block: A3  Comment(s): None  ([id number])    03/17/2022 - 05/19/2022 Chemotherapy   4 cycles TC chemotherapy   04/21/2022 Genetics   Patient has genetic testing done for triple negative breast cancer. Results revealed patient has the following mutation(s): Negative genetic testing on the Custom Cancer Panel. The report date is April 21, 2022.  The Custom-Cancer offered by [facility] includes sequencing and/or deletion/duplication analysis of the following 125 genes: ADA, AIP, ALK, ANKRD26*, APC*, ATM*, AXIN2, BAP1, BARD1, BLM, BMPR1A, BRCA1, BRCA2, BRIP1, CARD11, CARMIL2, CASP8, CBL, CD27, CDC73, CDH1, CDK4, CDKN1B,  CDKN2A (p14ARF), CDKN2A (p16INK4a), CEBPA, CHEK2, CTLA4, CTNNA1, CTPS1, DDX41, DICER1*, DOCK8, EGFR, ELANE, EPCAM*, ERCC6L2, ETV6, FADD, FAS, FASLG, FCHO1, FH*, FLCN, G6PC3, GATA2, GFI1*, GREM1*, HAX1, HOXB13, IKZF1, IL2RA, IL2RB, ITK, KIT, KRAS, LZTR1, MAGT1, MAX*, MBD4, MCM4, MECOM, MEN1*, MET*, MITF, MLH1*, MSH2*, MSH3*, MSH6*, MUTYH, NBN, NF1*, NF2, NTHL1, PALB2, PDGFRA, PIK3CD, PIK3R1, PMS2*, POLD1*, POLE, POT1, PRKAR1A, PRKCD, PTCH1, PTEN*, PTPN11, RAC2, RAD51C, RAD51D, RASGRP1, RB1*, RET, RHOH, RMRP, RTEL1, RUNX1, SAMD9, SAMD9L, SDHA*, SDHAF2, SDHB, SDHC*, SDHD, SH2D1A, SMAD4, SMARCA4, SMARCB1, SMARCE1, SRP72, STAT3, STK11, STK4, STXBP2, SUFU, TERC, TERT, TMEM127, TNFRSF13B, TP53, TPP2, TSC1*, TSC2, VHL, WAS, and XIAP.     06/25/2022 - 07/24/2022 Radiation   Radiation Therapy Treatment Details (Noted on 06/18/2022) Sites: Right Breast, Right Upper outer quadrant of breast Technique: 3D CRT Goal: Curative  Right Breast 267 cGy x 16 daily fractions for 4272 cGy followed by a tumor bed boost of 250 cGy x 4 =1000 cGy       Physical Exam: Vitals:   09/09/23 1054  BP: 131/84  Pulse: 72  Resp: 16  Temp: 36.8 C (98.2 F)  SpO2: 97%   Karnofsky Performance Status: 90,  Able to carry on normal activity; minor signs or symptoms of disease (ECOG equivalent 0) General:  No acute distress Neck: No JVD, No thyromegaly Cardio: Normal rate. Lungs: Normal work of breathing MSK: No arm edema. No bony tenderness. Range of motion stable Psych: Normal mood and affect.  Converses clearly and emotionally appropriate.  Breast:  Examination in the sitting and recumbent positions finds good symmetry.  There is no retraction.  The lumpectomy cavity on the right breast is smooth and firm without nodularity.  There are no nipple abnormalities, skin changes or masses bilaterally.  The sensitive parts of the examination were performed with Brittany Layman as the chaperone.  Imaging:  01/15/2023 Bilateral  Diagnostic Mammogram- BI RADS 2 IMPRESSION:  New lumpectomy changes in the right breast. No mammographic evidence  of malignancy in either breast.    Labs:         I personally spent 29 minutes face-to-face and non-face-to-face in the care of this patient, which includes all pre, intra, and post visit time on the date of service.  All documented time was specific to the E/M visit and does not include any procedures that may have been performed.    Alm CHARLENA Blumenthal, MD [facility] Radiation Oncology [phone]

## 2023-09-09 NOTE — Progress Notes (Signed)
 09/09/2023   Subjective/Assessment/Recommendations:  1. Fatigue: No issues 2. Pain: Denies pain 3. Elimination: No issues 4. Prescription Needs: None 5. Psychosocial: Patient lost her son in December of 2024. She reports she and her family have come together and are doing ok, as well as to be expected. Nurse provided emotional support over 20 minutes as the patient became tearful during the discussion. Patient states she is grateful to be able to speak of him and that she was there for his first and last breath.  6. Other: Patient in for follow up visit. Weight and vitals obtained. Patient states she is doing well. She continues to follow up as directed with medical oncology at [facility]. Patient had mammogram performed January of 2025 and is already scheduled for January of 2026 for mammography and US . Patient information reviewed with MD. Nurse present as chaperone for physical assessment.

## 2023-11-16 DIAGNOSIS — Z6825 Body mass index (BMI) 25.0-25.9, adult: Secondary | ICD-10-CM | POA: Diagnosis not present

## 2023-11-16 DIAGNOSIS — Z2089 Contact with and (suspected) exposure to other communicable diseases: Secondary | ICD-10-CM | POA: Diagnosis not present

## 2023-11-16 DIAGNOSIS — J0100 Acute maxillary sinusitis, unspecified: Secondary | ICD-10-CM | POA: Diagnosis not present

## 2023-11-16 DIAGNOSIS — Z20828 Contact with and (suspected) exposure to other viral communicable diseases: Secondary | ICD-10-CM | POA: Diagnosis not present

## 2023-11-26 DIAGNOSIS — Z6825 Body mass index (BMI) 25.0-25.9, adult: Secondary | ICD-10-CM | POA: Diagnosis not present

## 2023-11-26 DIAGNOSIS — R300 Dysuria: Secondary | ICD-10-CM | POA: Diagnosis not present

## 2023-12-01 ENCOUNTER — Inpatient Hospital Stay: Attending: Hematology

## 2023-12-01 DIAGNOSIS — Z452 Encounter for adjustment and management of vascular access device: Secondary | ICD-10-CM | POA: Insufficient documentation

## 2023-12-01 DIAGNOSIS — C50411 Malignant neoplasm of upper-outer quadrant of right female breast: Secondary | ICD-10-CM | POA: Insufficient documentation

## 2023-12-01 NOTE — Patient Instructions (Signed)
 [facility] [place] - A DEPT OF [place]  Discharge Instructions: Thank you for choosing [facility] to provide your oncology and hematology care.  If you have a lab appointment with the Cancer Center - please note that after April 8th, 2024, all labs will be drawn in the cancer center.  You do not have to check in or register with the main entrance as you have in the past but will complete your check-in in the cancer center.  Wear comfortable clothing and clothing appropriate for easy access to any Portacath or PICC line.   We strive to give you quality time with your provider. You may need to reschedule your appointment if you arrive late (15 or more minutes).  Arriving late affects you and other patients whose appointments are after yours.  Also, if you miss three or more appointments without notifying the office, you may be dismissed from the clinic at the provider's discretion.      For prescription refill requests, have your pharmacy contact our office and allow 72 hours for refills to be completed.    Today you received the following port flushed return as scheduled.   To help prevent nausea and vomiting after your treatment, we encourage you to take your nausea medication as directed.  BELOW ARE SYMPTOMS THAT SHOULD BE REPORTED IMMEDIATELY: *FEVER GREATER THAN 100.4 F (38 C) OR HIGHER *CHILLS OR SWEATING *NAUSEA AND VOMITING THAT IS NOT CONTROLLED WITH YOUR NAUSEA MEDICATION *UNUSUAL SHORTNESS OF BREATH *UNUSUAL BRUISING OR BLEEDING *URINARY PROBLEMS (pain or burning when urinating, or frequent urination) *BOWEL PROBLEMS (unusual diarrhea, constipation, pain near the anus) TENDERNESS IN MOUTH AND THROAT WITH OR WITHOUT PRESENCE OF ULCERS (sore throat, sores in mouth, or a toothache) UNUSUAL RASH, SWELLING OR PAIN  UNUSUAL VAGINAL DISCHARGE OR ITCHING   Items with * indicate a potential emergency and should be followed up as soon as possible  or go to the Emergency Department if any problems should occur.  Please show the CHEMOTHERAPY ALERT CARD or IMMUNOTHERAPY ALERT CARD at check-in to the Emergency Department and triage nurse.  Should you have questions after your visit or need to cancel or reschedule your appointment, please contact [facility] [place] - A DEPT OF [facility] [phone]  and follow the prompts.  Office hours are 8:00 a.m. to 4:30 p.m. Monday - Friday. Please note that voicemails left after 4:00 p.m. may not be returned until the following business day.  We are closed weekends and major holidays. You have access to a nurse at all times for urgent questions. Please call the main number to the clinic [phone] and follow the prompts.  For any non-urgent questions, you may also contact your provider using MyChart. We now offer e-Visits for anyone 58 and older to request care online for non-urgent symptoms. For details visit [email or web address].   Also download the MyChart app! Go to the app store, search MyChart, open the app, select [facility], and log in with your MyChart username and password.

## 2023-12-01 NOTE — Progress Notes (Signed)
 Port flushed with good blood return noted. No bruising or swelling at site. Bandaid applied and patient discharged in satisfactory condition. VVS stable with no signs or symptoms of distressed noted.

## 2024-01-04 ENCOUNTER — Encounter: Payer: Self-pay | Admitting: *Deleted

## 2024-01-19 ENCOUNTER — Ambulatory Visit (HOSPITAL_COMMUNITY)
Admission: RE | Admit: 2024-01-19 | Discharge: 2024-01-19 | Disposition: A | Source: Ambulatory Visit | Attending: Oncology | Admitting: Oncology

## 2024-01-19 ENCOUNTER — Encounter (HOSPITAL_COMMUNITY): Payer: Self-pay

## 2024-01-19 ENCOUNTER — Ambulatory Visit (HOSPITAL_COMMUNITY)
Admission: RE | Admit: 2024-01-19 | Discharge: 2024-01-19 | Disposition: A | Discharge status: Home / Self Care | Source: Ambulatory Visit | Attending: Oncology | Admitting: Oncology

## 2024-01-19 DIAGNOSIS — Z171 Estrogen receptor negative status [ER-]: Secondary | ICD-10-CM | POA: Insufficient documentation

## 2024-01-19 DIAGNOSIS — C50411 Malignant neoplasm of upper-outer quadrant of right female breast: Secondary | ICD-10-CM | POA: Diagnosis present

## 2024-01-19 DIAGNOSIS — Z9889 Other specified postprocedural states: Secondary | ICD-10-CM | POA: Diagnosis present

## 2024-01-27 ENCOUNTER — Inpatient Hospital Stay: Attending: Hematology

## 2024-01-27 DIAGNOSIS — C50411 Malignant neoplasm of upper-outer quadrant of right female breast: Secondary | ICD-10-CM

## 2024-01-27 DIAGNOSIS — E559 Vitamin D deficiency, unspecified: Secondary | ICD-10-CM

## 2024-01-27 LAB — COMPREHENSIVE METABOLIC PANEL WITH GFR
ALT: 24 U/L (ref 0–44)
AST: 25 U/L (ref 15–41)
Albumin: 4.4 g/dL (ref 3.5–5.0)
Alkaline Phosphatase: 59 U/L (ref 38–126)
Anion gap: 14 (ref 5–15)
BUN: 13 mg/dL (ref 8–23)
CO2: 23 mmol/L (ref 22–32)
Calcium: 9.1 mg/dL (ref 8.9–10.3)
Chloride: 105 mmol/L (ref 98–111)
Creatinine, Ser: 0.77 mg/dL (ref 0.44–1.00)
GFR, Estimated: >60 mL/min
Glucose, Bld: 133 mg/dL — ABNORMAL HIGH (ref 70–99)
Potassium: 3.5 mmol/L (ref 3.5–5.1)
Sodium: 142 mmol/L (ref 135–145)
Total Bilirubin: 0.5 mg/dL (ref 0.0–1.2)
Total Protein: 6.8 g/dL (ref 6.5–8.1)

## 2024-01-27 LAB — CBC WITH DIFFERENTIAL/PLATELET
Abs Immature Granulocytes: 0.02 K/uL (ref 0.00–0.07)
Basophils Absolute: 0.1 K/uL (ref 0.0–0.1)
Basophils Relative: 1 %
Eosinophils Absolute: 0.1 K/uL (ref 0.0–0.5)
Eosinophils Relative: 1 %
HCT: 40.4 % (ref 36.0–46.0)
Hemoglobin: 13.8 g/dL (ref 12.0–15.0)
Immature Granulocytes: 0 %
Lymphocytes Relative: 26 %
Lymphs Abs: 1.4 K/uL (ref 0.7–4.0)
MCH: 31.7 pg (ref 26.0–34.0)
MCHC: 34.2 g/dL (ref 30.0–36.0)
MCV: 92.7 fL (ref 80.0–100.0)
Monocytes Absolute: 0.4 K/uL (ref 0.1–1.0)
Monocytes Relative: 7 %
Neutro Abs: 3.6 K/uL (ref 1.7–7.7)
Neutrophils Relative %: 65 %
Platelets: 131 K/uL — ABNORMAL LOW (ref 150–400)
RBC: 4.36 MIL/uL (ref 3.87–5.11)
RDW: 12.3 % (ref 11.5–15.5)
WBC: 5.6 K/uL (ref 4.0–10.5)
nRBC: 0 % (ref 0.0–0.2)

## 2024-01-27 LAB — VITAMIN D 25 HYDROXY (VIT D DEFICIENCY, FRACTURES): Vit D, 25-Hydroxy: 41.4 ng/mL (ref 30–100)

## 2024-01-27 NOTE — Progress Notes (Signed)
 Patients port flushed without difficulty.  Good blood return noted with no bruising or swelling noted at site.  Band aid applied.  VSS with discharge and left in satisfactory condition with no s/s of distress noted.

## 2024-01-28 LAB — CANCER ANTIGEN 15-3: CA 15-3: <3 U/mL (ref 0.0–25.0)

## 2024-02-03 ENCOUNTER — Inpatient Hospital Stay: Admitting: Oncology

## 2024-02-03 DIAGNOSIS — E559 Vitamin D deficiency, unspecified: Secondary | ICD-10-CM

## 2024-02-03 DIAGNOSIS — D696 Thrombocytopenia, unspecified: Secondary | ICD-10-CM | POA: Diagnosis not present

## 2024-02-03 DIAGNOSIS — Z171 Estrogen receptor negative status [ER-]: Secondary | ICD-10-CM

## 2024-02-03 DIAGNOSIS — I890 Lymphedema, not elsewhere classified: Secondary | ICD-10-CM

## 2024-02-03 DIAGNOSIS — C50411 Malignant neoplasm of upper-outer quadrant of right female breast: Secondary | ICD-10-CM | POA: Diagnosis not present

## 2024-02-03 DIAGNOSIS — E876 Hypokalemia: Secondary | ICD-10-CM

## 2024-02-03 NOTE — Assessment & Plan Note (Addendum)
-  This is been chronically low for several years now. -She denies any easy bruising or bleeding.  There is no splenomegaly. -Platelets mildly improved from previous.

## 2024-02-03 NOTE — Assessment & Plan Note (Addendum)
-  She is currently on a diuretic and reports that she does pee a lot.   -Currently stable. -We discussed continuing to monitor her potassium levels.  If she consumes a normal American diet, her potassium level should be WNL but she has had some diarrhea as well as increased urinary urgency secondary to her diuretic.  She will be mindful and eat a banana every few days.

## 2024-02-03 NOTE — Assessment & Plan Note (Signed)
-  Has been complaining of this for over a year now.  Patient did try to do her own massage techniques but has noticed it is slightly worsened over the past 6 months or so.  Reports is worse after she uses it for a while.  She also feels some swelling in her right breast and right upper back. -Patient is interested in being followed by the lymphedema clinic.  Referral placed.

## 2024-02-03 NOTE — Assessment & Plan Note (Addendum)
-  She denies any new lumps or bumps. - Denies any new onset pains.  Denies any B symptoms. - Reviewed mammogram from 01/19/2024 which did not reveal evidence of right or left breast malignancy.  Postsurgical changes of upper outer right breast and axilla again seen.  Recommend diagnostic mammogram in 1 year. - Labs: Normal LFTs.  CBC grossly normal.  Mild thrombocytopenia since 2018 is stable.  CA 15-3 is normal. - Recommend follow-up in 6 months with repeat labs and vitamin D  levels. -She will be due for a repeat mammogram in January/February 2027.

## 2024-02-03 NOTE — Progress Notes (Signed)
 "  [facility] OFFICE PROGRESS NOTE  Angel Vaughn FALCON, MD  ASSESSMENT & PLAN:  I connected with Angel Barry on 02/03/24 at 11:00 AM EST by telephone visit and verified that I am speaking with the correct person using two identifiers.   I discussed the limitations, risks, security and privacy concerns of performing an evaluation and management service by telemedicine and the availability of in-person appointments. I also discussed with the patient that there may be a patient responsible charge related to this service. The patient expressed understanding and agreed to proceed.   Other persons participating in the visit and their role in the encounter: NP, Patient   Patients location: Home  Providers location: Clinic     Assessment & Plan Carcinoma of upper-outer quadrant of right breast in female, estrogen receptor negative (HCC) -She denies any new lumps or bumps. - Denies any new onset pains.  Denies any B symptoms. - Reviewed mammogram from 01/19/2024 which did not reveal evidence of right or left breast malignancy.  Postsurgical changes of upper outer right breast and axilla again seen.  Recommend diagnostic mammogram in 1 year. - Labs: Normal LFTs.  CBC grossly normal.  Mild thrombocytopenia since 2018 is stable.  CA 15-3 is normal. - Recommend follow-up in 6 months with repeat labs and vitamin D  levels. -She will be due for a repeat mammogram in January/February 2027.  Hypokalemia -She is currently on a diuretic and reports that she does pee a lot.   -Currently stable. -We discussed continuing to monitor her potassium levels.  If she consumes a normal American diet, her potassium level should be WNL but she has had some diarrhea as well as increased urinary urgency secondary to her diuretic.  She will be mindful and eat a banana every few days. Thrombocytopenia -This is been chronically low for several years now. -She denies any easy bruising or bleeding.  There  is no splenomegaly. -Platelets mildly improved from previous. Vitamin D  deficiency Stable. Lymphedema of right arm -Has been complaining of this for over a year now.  Patient did try to do her own massage techniques but has noticed it is slightly worsened over the past 6 months or so.  Reports is worse after she uses it for a while.  She also feels some swelling in her right breast and right upper back. -Patient is interested in being followed by the lymphedema clinic.  Referral placed.  Orders Placed This Encounter  Procedures   Vitamin D  25 hydroxy    Standing Status:   Future    Expected Date:   08/02/2024    Expiration Date:   10/31/2024   Cancer antigen 15-3    Standing Status:   Future    Expected Date:   08/02/2024    Expiration Date:   10/31/2024   CBC with Differential    Standing Status:   Future    Expected Date:   08/02/2024    Expiration Date:   10/31/2024   Comprehensive metabolic panel    Standing Status:   Future    Expected Date:   08/02/2024    Expiration Date:   10/31/2024    INTERVAL HISTORY: Patient returns for follow-up.  Reports she has overall been doing well.  Appetite is 100% energy levels are 75%.  She denies any pain.  She denies any new lumps or bumps.  She has a cough secondary to allergies.  She has constipation but takes MiraLAX and a stool softener  as needed.  Reports she is still coping with the death of her son from glioblastoma last December.  Reports she will occasionally have a little bit of swelling in her right arm and in her breast.  She has not been seen by lymphedema clinic.  She is interested in hearing more about the lymphedema clinic.  Reports she is having a tooth pulled tomorrow and is currently on antibiotic.  She is not looking forward to this.  We reviewed cbc, CMP, CA 15-3 and vitamin D .  SUMMARY OF HEMATOLOGIC HISTORY: Oncology History  Carcinoma of upper-outer quadrant of right breast in female, estrogen receptor negative (HCC)   01/23/2022 Initial Diagnosis   Carcinoma of upper-outer quadrant of right breast in female, estrogen receptor negative (HCC)   03/04/2022 Cancer Staging   Staging form: Breast, AJCC 8th Edition - Clinical stage from 03/04/2022: Stage IB (cT1c, cN0, cM0, G3, ER-, PR-, HER2-) - Signed by Rogers Hai, MD on 03/04/2022 Histopathologic type: Infiltrating duct carcinoma, NOS Stage prefix: Initial diagnosis Nuclear grade: G3 Histologic grading system: 3 grade system   03/17/2022 - 05/21/2022 Chemotherapy   Patient is on Treatment Plan : BREAST TC q21d     04/21/2022 Genetic Testing   Negative genetic testing on the Custom Cancer Panel.  The report date is April 21, 2022.  The Custom-Cancer offered by [facility] includes sequencing and/or deletion/duplication analysis of the following 125 genes:  ADA, AIP, ALK, ANKRD26*, APC*, ATM*, AXIN2, BAP1, BARD1, BLM, BMPR1A, BRCA1, BRCA2, BRIP1, CARD11, CARMIL2, CASP8, CBL, CD27, CDC73, CDH1, CDK4, CDKN1B, CDKN2A (p14ARF), CDKN2A (p16INK4a), CEBPA, CHEK2, CTLA4, CTNNA1, CTPS1, DDX41, DICER1*, DOCK8, EGFR, ELANE, EPCAM*, ERCC6L2, ETV6, FADD, FAS, FASLG, FCHO1, FH*, FLCN, G6PC3, GATA2, GFI1*, GREM1*, HAX1, HOXB13, IKZF1, IL2RA, IL2RB, ITK, KIT, KRAS, LZTR1, MAGT1, MAX*, MBD4, MCM4, MECOM, MEN1*, MET*, MITF, MLH1*, MSH2*, MSH3*, MSH6*, MUTYH, NBN, NF1*, NF2, NTHL1, PALB2, PDGFRA, PIK3CD, PIK3R1, PMS2*, POLD1*, POLE, POT1, PRKAR1A, PRKCD, PTCH1, PTEN*, PTPN11, RAC2, RAD51C, RAD51D, RASGRP1, RB1*, RET, RHOH, RMRP, RTEL1, RUNX1, SAMD9, SAMD9L, SDHA*, SDHAF2, SDHB, SDHC*, SDHD, SH2D1A, SMAD4, SMARCA4, SMARCB1, SMARCE1, SRP72, STAT3, STK11, STK4, STXBP2, SUFU, TERC, TERT, TMEM127, TNFRSF13B, TP53, TPP2, TSC1*, TSC2, VHL, WAS, and XIAP.       CBC    Component Value Date/Time   WBC 5.6 01/27/2024 1158   RBC 4.36 01/27/2024 1158   HGB 13.8 01/27/2024 1158   HCT 40.4 01/27/2024 1158   PLT 131 (L) 01/27/2024 1158   MCV 92.7 01/27/2024 1158   MCH 31.7 01/27/2024 1158    MCHC 34.2 01/27/2024 1158   RDW 12.3 01/27/2024 1158   LYMPHSABS 1.4 01/27/2024 1158   MONOABS 0.4 01/27/2024 1158   EOSABS 0.1 01/27/2024 1158   BASOSABS 0.1 01/27/2024 1158       Latest Ref Rng & Units 01/27/2024   11:58 AM 08/26/2023   11:55 AM 03/02/2023   12:42 PM  CMP  Glucose 70 - 99 mg/dL 866  858  898   BUN 8 - 23 mg/dL 13  16  16    Creatinine 0.44 - 1.00 mg/dL 9.22  9.22  9.13   Sodium 135 - 145 mmol/L 142  141  137   Potassium 3.5 - 5.1 mmol/L 3.5  3.3  3.7   Chloride 98 - 111 mmol/L 105  104  104   CO2 22 - 32 mmol/L 23  24  24    Calcium 8.9 - 10.3 mg/dL 9.1  8.9  9.2   Total Protein 6.5 - 8.1 g/dL 6.8  6.5  6.8   Total Bilirubin 0.0 - 1.2 mg/dL 0.5  0.6  0.7   Alkaline Phos 38 - 126 U/L 59  50  47   AST 15 - 41 U/L 25  22  18    ALT 0 - 44 U/L 24  23  16       No results found for: FERRITIN, VITAMINB12  There were no vitals filed for this visit.  Review of System:  Review of Systems  Constitutional:  Positive for malaise/fatigue.  Respiratory:  Positive for cough.   Gastrointestinal:  Positive for constipation.  Musculoskeletal:        Right upper arm swelling  Psychiatric/Behavioral:  Positive for depression.     Physical Exam: Physical Exam Neurological:     Mental Status: She is alert. She is disoriented.      I provided 20 minutes of non face-to-face telephone visit time during this encounter, and > 50% was spent counseling as documented under my assessment & plan.   Delon Hope, NP 02/03/2024 12:13 PM "

## 2024-02-19 ENCOUNTER — Ambulatory Visit (HOSPITAL_COMMUNITY): Admitting: Physical Therapy

## 2024-02-22 ENCOUNTER — Ambulatory Visit (HOSPITAL_COMMUNITY): Admitting: Physical Therapy

## 2024-02-24 ENCOUNTER — Ambulatory Visit (HOSPITAL_COMMUNITY): Admitting: Physical Therapy

## 2024-02-26 ENCOUNTER — Ambulatory Visit (HOSPITAL_COMMUNITY)

## 2024-03-01 ENCOUNTER — Ambulatory Visit (HOSPITAL_COMMUNITY): Admitting: Physical Therapy

## 2024-03-03 ENCOUNTER — Ambulatory Visit (HOSPITAL_COMMUNITY): Admitting: Physical Therapy

## 2024-03-07 ENCOUNTER — Ambulatory Visit (HOSPITAL_COMMUNITY): Admitting: Physical Therapy

## 2024-03-09 ENCOUNTER — Ambulatory Visit (HOSPITAL_COMMUNITY): Admitting: Physical Therapy

## 2024-03-11 ENCOUNTER — Ambulatory Visit (HOSPITAL_COMMUNITY): Admitting: Physical Therapy

## 2024-05-03 ENCOUNTER — Inpatient Hospital Stay

## 2024-08-02 ENCOUNTER — Inpatient Hospital Stay

## 2024-08-09 ENCOUNTER — Inpatient Hospital Stay: Admitting: Oncology
